# Patient Record
Sex: Female | Born: 1950
Health system: Southern US, Community
[De-identification: ages and names within clinical notes are randomized; demographics above are authoritative.]

## PROBLEM LIST (undated history)

## (undated) DIAGNOSIS — E785 Hyperlipidemia, unspecified: Secondary | ICD-10-CM

## (undated) DIAGNOSIS — T7840XA Allergy, unspecified, initial encounter: Secondary | ICD-10-CM

## (undated) DIAGNOSIS — M1990 Unspecified osteoarthritis, unspecified site: Secondary | ICD-10-CM

## (undated) DIAGNOSIS — E039 Hypothyroidism, unspecified: Secondary | ICD-10-CM

## (undated) DIAGNOSIS — K469 Unspecified abdominal hernia without obstruction or gangrene: Secondary | ICD-10-CM

## (undated) DIAGNOSIS — K5792 Diverticulitis of intestine, part unspecified, without perforation or abscess without bleeding: Secondary | ICD-10-CM

## (undated) DIAGNOSIS — R87619 Unspecified abnormal cytological findings in specimens from cervix uteri: Secondary | ICD-10-CM

## (undated) DIAGNOSIS — F419 Anxiety disorder, unspecified: Secondary | ICD-10-CM

## (undated) DIAGNOSIS — I10 Essential (primary) hypertension: Secondary | ICD-10-CM

## (undated) HISTORY — DX: Hyperlipidemia, unspecified: E78.5

## (undated) HISTORY — PX: COLPOSCOPY: SHX161

## (undated) HISTORY — DX: Unspecified osteoarthritis, unspecified site: M19.90

## (undated) HISTORY — DX: Diverticulitis of intestine, part unspecified, without perforation or abscess without bleeding: K57.92

## (undated) HISTORY — DX: Unspecified abdominal hernia without obstruction or gangrene: K46.9

## (undated) HISTORY — DX: Anxiety disorder, unspecified: F41.9

## (undated) HISTORY — DX: Hypothyroidism, unspecified: E03.9

## (undated) HISTORY — DX: Essential (primary) hypertension: I10

## (undated) HISTORY — DX: Allergy, unspecified, initial encounter: T78.40XA

## (undated) HISTORY — DX: Unspecified abnormal cytological findings in specimens from cervix uteri: R87.619

---
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
## 1991-01-14 HISTORY — PX: ABDOMINAL HYSTERECTOMY: SHX81

## 1996-01-14 DIAGNOSIS — E785 Hyperlipidemia, unspecified: Secondary | ICD-10-CM

## 1996-01-14 HISTORY — DX: Hyperlipidemia, unspecified: E78.5

## 1997-07-19 ENCOUNTER — Encounter: Admission: RE | Admit: 1997-07-19 | Discharge: 1997-10-17 | Payer: Self-pay | Admitting: Cardiology

## 1997-12-21 ENCOUNTER — Ambulatory Visit (HOSPITAL_COMMUNITY): Admission: RE | Admit: 1997-12-21 | Discharge: 1997-12-21 | Payer: Self-pay | Admitting: Gynecology

## 1997-12-21 ENCOUNTER — Encounter: Payer: Self-pay | Admitting: Gynecology

## 1999-12-27 ENCOUNTER — Other Ambulatory Visit: Admission: RE | Admit: 1999-12-27 | Discharge: 1999-12-27 | Payer: Self-pay | Admitting: Gynecology

## 2000-10-10 ENCOUNTER — Encounter: Payer: Self-pay | Admitting: Emergency Medicine

## 2000-10-10 ENCOUNTER — Emergency Department (HOSPITAL_COMMUNITY): Admission: EM | Admit: 2000-10-10 | Discharge: 2000-10-10 | Payer: Self-pay | Admitting: Emergency Medicine

## 2000-10-13 ENCOUNTER — Encounter: Payer: Self-pay | Admitting: General Surgery

## 2000-10-13 ENCOUNTER — Encounter (INDEPENDENT_AMBULATORY_CARE_PROVIDER_SITE_OTHER): Payer: Self-pay | Admitting: *Deleted

## 2000-10-13 ENCOUNTER — Ambulatory Visit (HOSPITAL_COMMUNITY): Admission: RE | Admit: 2000-10-13 | Discharge: 2000-10-14 | Payer: Self-pay | Admitting: General Surgery

## 2002-01-13 HISTORY — PX: LAPAROSCOPIC CHOLECYSTECTOMY: SUR755

## 2002-02-23 ENCOUNTER — Other Ambulatory Visit: Admission: RE | Admit: 2002-02-23 | Discharge: 2002-02-23 | Payer: Self-pay | Admitting: Gynecology

## 2003-02-23 ENCOUNTER — Encounter: Admission: RE | Admit: 2003-02-23 | Discharge: 2003-02-23 | Payer: Self-pay | Admitting: Internal Medicine

## 2003-03-17 ENCOUNTER — Other Ambulatory Visit: Admission: RE | Admit: 2003-03-17 | Discharge: 2003-03-17 | Payer: Self-pay | Admitting: Gynecology

## 2004-04-01 ENCOUNTER — Other Ambulatory Visit: Admission: RE | Admit: 2004-04-01 | Discharge: 2004-04-01 | Payer: Self-pay | Admitting: Gynecology

## 2005-04-14 ENCOUNTER — Other Ambulatory Visit: Admission: RE | Admit: 2005-04-14 | Discharge: 2005-04-14 | Payer: Self-pay | Admitting: Gynecology

## 2006-01-13 DIAGNOSIS — E039 Hypothyroidism, unspecified: Secondary | ICD-10-CM

## 2006-01-13 HISTORY — DX: Hypothyroidism, unspecified: E03.9

## 2006-05-15 ENCOUNTER — Other Ambulatory Visit: Admission: RE | Admit: 2006-05-15 | Discharge: 2006-05-15 | Payer: Self-pay | Admitting: Gynecology

## 2006-07-31 ENCOUNTER — Emergency Department (HOSPITAL_COMMUNITY): Admission: EM | Admit: 2006-07-31 | Discharge: 2006-07-31 | Payer: Self-pay | Admitting: Emergency Medicine

## 2009-05-18 ENCOUNTER — Encounter: Admission: RE | Admit: 2009-05-18 | Discharge: 2009-05-18 | Payer: Self-pay | Admitting: Internal Medicine

## 2010-04-22 ENCOUNTER — Other Ambulatory Visit: Payer: Self-pay | Admitting: Internal Medicine

## 2010-04-22 DIAGNOSIS — Z1231 Encounter for screening mammogram for malignant neoplasm of breast: Secondary | ICD-10-CM

## 2010-05-28 ENCOUNTER — Ambulatory Visit
Admission: RE | Admit: 2010-05-28 | Discharge: 2010-05-28 | Disposition: A | Discharge status: Home / Self Care | Payer: Commercial Indemnity | Source: Ambulatory Visit | Attending: Internal Medicine | Admitting: Internal Medicine

## 2010-05-28 DIAGNOSIS — Z1231 Encounter for screening mammogram for malignant neoplasm of breast: Secondary | ICD-10-CM

## 2010-05-31 NOTE — Op Note (Signed)
[place]  Patient:    Erin Day, Erin Day Visit Number: [id number] MRN: [id number]          Service Type: DSU Location: [phone] Attending Physician:  Delsa Bern Dictated by:   Lorne Skeens. Hoxworth, M.D. Proc. Date: 10/13/00 Admit Date:  10/13/2000                             Operative Report  PREOPERATIVE DIAGNOSES: 1. Cholelithiasis. 2. Cholecystitis.  POSTOPERATIVE DIAGNOSES: 1. Cholelithiasis. 2. Cholecystitis.  OPERATION:  Laparoscopic cholecystectomy with intraoperative cholangiogram.  SURGEON:  Sharlet Salina T. Hoxworth, M.D.  ASSISTANT:  Gita Kudo, M.D.  ANESTHESIA:  General.  INDICATION:  Erin Day is a 60 year old white female who approximately three to four days ago had the onset of epigastric and right upper quadrant pain which caused her to present to the emergency room.  At that time, a gallbladder ultrasound was obtained showing multiple gallstone and a normal common bile duct.  Her severe pain has resolved but she has remained sore, tired, and has somewhat upper quadrant tenderness on exam in the office yesterday.  Urgent laparoscopic cholecystectomy with intraoperative cholangiogram was then recommended and accepted.  The nature of the procedure, its indications, risks of bleeding, infection, bile leak, and bile duct injury were discussed and understood preoperatively. She is now brought to the operating room for this procedure.  DESCRIPTION OF PROCEDURE:  The patient was brought to the operating room and placed in the supine position on the operating table and general endotracheal anesthesia was induced.  She received preoperative antibiotics.  PS were in place.  The abdomen was sterilely prepped and draped.  Local anesthesia was used to infiltrate the trocar sites.  Then a 1 cm incision was made at the umbilicus and dissection carried down the midline fascia which was sharply incised for  1 cm and the peritoneum entered under direct vision.  A two mattress of 0 Vicryl, the Hasson trocar was placed, and pneumoperitoneum was established.  Under direct vision, a 10 mm trocar was placed in the subxiphoid area and two 5 mm trocars were on the right subcostal margin.  The gallbladder was visualized and was found to be acutely inflamed.  It was decompressed with needle aspirator and inflammatory omental adhesions stripped down off towards the porta hepatis.  The fundus was grasped and elevated up over the liver and the infundibulum was retracted inferolaterally.  Fibrous fatty tissue was stripped off the neck of the gallbladder toward the porta hepatis.  The distal gallbladder was thoroughly dissected.  The cystic duct was identified and the cystic duct gallbladder junction was dissected 360 degrees.  When the anatomy was clear, the cystic duct was clipped to the gallbladder junction and operative cholangiogram obtained through cystic duct.  This showed normal intrahepatic, common hepatic, and common bile ducts with free flow into the duodenum and no filling defects.  Following this, the cholangiocatheter was removed and the cystic duct was doubly clipped proximally and divided.  The cystic artery was seen and the Calot triangle coursing up on the gallbladder wall and was divided between two proximal and one distal clip.  The gallbladder was then dissected free from its bed using hook cautery, placed in the endocatch bag, and removed through the umbilicus.  The gallbladder bed was cauterized and the right upper quadrant thoroughly irrigated and suctioned.  Good hemostasis was assured.  Trocars were removed under  direct vision.  All CO2 evacuate from the peritoneal cavity.  The purse-string suture was secured to the umbilicus.  Skin incisions were closed with interrupted subcuticular 4-0 Monocryl and Steri-Strips.  Sponge, needle, and instrument counts were correct.  Dry,  sterile dressing was applied and the patient was taken to the recovery room in good condition. Dictated by:   Lorne Skeens. Hoxworth, M.D. Attending Physician:  Delsa Bern DD:  10/13/00 TD:  10/13/00 Job: [id number]

## 2011-05-06 ENCOUNTER — Other Ambulatory Visit (HOSPITAL_COMMUNITY): Payer: Self-pay | Admitting: Internal Medicine

## 2011-05-06 DIAGNOSIS — Z1231 Encounter for screening mammogram for malignant neoplasm of breast: Secondary | ICD-10-CM

## 2011-05-30 ENCOUNTER — Ambulatory Visit
Admission: RE | Admit: 2011-05-30 | Discharge: 2011-05-30 | Disposition: A | Discharge status: Home / Self Care | Payer: Commercial Indemnity | Source: Ambulatory Visit | Attending: Internal Medicine | Admitting: Internal Medicine

## 2011-05-30 DIAGNOSIS — Z1231 Encounter for screening mammogram for malignant neoplasm of breast: Secondary | ICD-10-CM

## 2012-04-16 ENCOUNTER — Encounter: Payer: Self-pay | Admitting: Internal Medicine

## 2012-04-16 ENCOUNTER — Other Ambulatory Visit: Payer: Self-pay

## 2012-04-16 DIAGNOSIS — Z1231 Encounter for screening mammogram for malignant neoplasm of breast: Secondary | ICD-10-CM

## 2012-06-04 ENCOUNTER — Encounter: Payer: Self-pay | Admitting: Internal Medicine

## 2012-06-04 ENCOUNTER — Ambulatory Visit (AMBULATORY_SURGERY_CENTER): Payer: Commercial Indemnity | Admitting: *Deleted

## 2012-06-04 VITALS — Ht 62.0 in | Wt 158.6 lb

## 2012-06-04 DIAGNOSIS — Z1211 Encounter for screening for malignant neoplasm of colon: Secondary | ICD-10-CM

## 2012-06-04 MED ORDER — MOVIPREP 100 G PO SOLR: ORAL | Status: DC

## 2012-06-11 ENCOUNTER — Ambulatory Visit
Admission: RE | Admit: 2012-06-11 | Discharge: 2012-06-11 | Disposition: A | Discharge status: Home / Self Care | Payer: Commercial Indemnity | Source: Ambulatory Visit

## 2012-06-11 DIAGNOSIS — Z1231 Encounter for screening mammogram for malignant neoplasm of breast: Secondary | ICD-10-CM

## 2012-06-13 HISTORY — PX: SKIN CANCER EXCISION: SHX779

## 2012-06-18 ENCOUNTER — Encounter: Payer: Self-pay | Admitting: Internal Medicine

## 2012-06-18 ENCOUNTER — Ambulatory Visit (AMBULATORY_SURGERY_CENTER): Payer: Commercial Indemnity | Admitting: Internal Medicine

## 2012-06-18 VITALS — BP 144/76 | HR 57 | Temp 97.8°F | Resp 27 | Ht 62.0 in | Wt 158.0 lb

## 2012-06-18 DIAGNOSIS — Z1211 Encounter for screening for malignant neoplasm of colon: Secondary | ICD-10-CM

## 2012-06-18 MED ORDER — SODIUM CHLORIDE 0.9 % IV SOLN: 500.0000 mL | INTRAVENOUS | Status: DC

## 2012-06-18 NOTE — Progress Notes (Signed)
Patient did not experience any of the following events: a burn prior to discharge; a fall within the facility; wrong site/side/patient/procedure/implant event; or a hospital transfer or hospital admission upon discharge from the facility. (G8907) Patient did not have preoperative order for IV antibiotic SSI prophylaxis. (G8918)  

## 2012-06-18 NOTE — Op Note (Signed)
[facility] [address] [place], [address]   COLONOSCOPY PROCEDURE REPORT  PATIENT: Erin, Day  MR#: [id number] BIRTHDATE: 08/19/1950 , 62  yrs. old GENDER: Female ENDOSCOPIST: Hart Carwin, MD REFERRED BY:  Creola Corn, M.D. PROCEDURE DATE:  06/18/2012 PROCEDURE:   Colonoscopy, screening ASA CLASS:   Class II INDICATIONS:Average risk patient for colon cancer and colonoscopy 2004 was normal. MEDICATIONS: MAC sedation, administered by CRNA and propofol (Diprivan) 250mg  IV  DESCRIPTION OF PROCEDURE:   After the risks and benefits and of the procedure were explained, informed consent was obtained.  A digital rectal exam revealed no abnormalities of the rectum.    The LB [id number]  endoscope was introduced through the anus and advanced to the cecum, which was identified by both the appendix and ileocecal valve .  The quality of the prep was good, using MoviPrep .  The instrument was then slowly withdrawn as the colon was fully examined.     COLON FINDINGS: There was moderate diverticulosis noted in the sigmoid colon with associated angulation, tortuosity and muscular hypertrophy.     Retroflexed views revealed no abnormalities. The scope was then withdrawn from the patient and the procedure completed.  COMPLICATIONS: There were no complications. ENDOSCOPIC IMPRESSION: There was moderate diverticulosis noted in the sigmoid colon  RECOMMENDATIONS: High fiber diet   REPEAT EXAM: In 10 year(s)  for Colonoscopy.  cc:  _______________________________ eSignedHart Carwin, MD 06/18/2012 9:02 AM     PATIENT NAME:  Erin, Day MR#: [id number]

## 2012-06-18 NOTE — Patient Instructions (Addendum)
YOU HAD AN ENDOSCOPIC PROCEDURE TODAY AT THE [facility]: Refer to the procedure report that was given to you for any specific questions about what was found during the examination.  If the procedure report does not answer your questions, please call your gastroenterologist to clarify.  If you requested that your care partner not be given the details of your procedure findings, then the procedure report has been included in a sealed envelope for you to review at your convenience later.  YOU SHOULD EXPECT: Some feelings of bloating in the abdomen. Passage of more gas than usual.  Walking can help get rid of the air that was put into your GI tract during the procedure and reduce the bloating. If you had a lower endoscopy (such as a colonoscopy or flexible sigmoidoscopy) you may notice spotting of blood in your stool or on the toilet paper. If you underwent a bowel prep for your procedure, then you may not have a normal bowel movement for a few days.  DIET: Your first meal following the procedure should be a light meal and then it is ok to progress to your normal diet.  A half-sandwich or bowl of soup is an example of a good first meal.  Heavy or fried foods are harder to digest and may make you feel nauseous or bloated.  Likewise meals heavy in dairy and vegetables can cause extra gas to form and this can also increase the bloating.  Drink plenty of fluids but you should avoid alcoholic beverages for 24 hours.  ACTIVITY: Your care partner should take you home directly after the procedure.  You should plan to take it easy, moving slowly for the rest of the day.  You can resume normal activity the day after the procedure however you should NOT DRIVE or use heavy machinery for 24 hours (because of the sedation medicines used during the test).    SYMPTOMS TO REPORT IMMEDIATELY: A gastroenterologist can be reached at any hour.  During normal business hours, 8:30 AM to 5:00 PM Monday through Friday,  call [phone].  After hours and on weekends, please call the GI answering service at [phone] who will take a message and have the physician on call contact you.   Following lower endoscopy (colonoscopy or flexible sigmoidoscopy):  Excessive amounts of blood in the stool  Significant tenderness or worsening of abdominal pains  Swelling of the abdomen that is new, acute  Fever of 100F or higher  FOLLOW UP: If any biopsies were taken you will be contacted by phone or by letter within the next 1-3 weeks.  Call your gastroenterologist if you have not heard about the biopsies in 3 weeks.  Our staff will call the home number listed on your records the next business day following your procedure to check on you and address any questions or concerns that you may have at that time regarding the information given to you following your procedure. This is a courtesy call and so if there is no answer at the home number and we have not heard from you through the emergency physician on call, we will assume that you have returned to your regular daily activities without incident.   Diverticulosis, high fiber diet, next colonoscopy 10 years-2024  SIGNATURES/CONFIDENTIALITY: You and/or your care partner have signed paperwork which will be entered into your electronic medical record.  These signatures attest to the fact that that the information above on your After Visit Summary has been reviewed and  is understood.  Full responsibility of the confidentiality of this discharge information lies with you and/or your care-partner.

## 2012-06-21 ENCOUNTER — Telehealth: Payer: Self-pay | Admitting: *Deleted

## 2012-06-21 NOTE — Telephone Encounter (Signed)
  Follow up Call-  Call back number 06/18/2012  Post procedure Call Back phone  # [phone]  Permission to leave phone message Yes     Patient questions:  Do you have a fever, pain , or abdominal swelling? no Pain Score  0 *  Have you tolerated food without any problems? yes  Have you been able to return to your normal activities? yes  Do you have any questions about your discharge instructions: Diet   no Medications  no Follow up visit  no  Do you have questions or concerns about your Care? no  Actions: * If pain score is 4 or above: No action needed, pain <4.

## 2012-06-29 ENCOUNTER — Other Ambulatory Visit: Payer: Self-pay

## 2012-07-08 ENCOUNTER — Other Ambulatory Visit: Payer: Self-pay

## 2012-08-05 ENCOUNTER — Encounter: Payer: Self-pay | Admitting: *Deleted

## 2012-08-06 ENCOUNTER — Ambulatory Visit (INDEPENDENT_AMBULATORY_CARE_PROVIDER_SITE_OTHER): Payer: Managed Care, Other (non HMO) | Admitting: Nurse Practitioner

## 2012-08-06 ENCOUNTER — Encounter: Payer: Self-pay | Admitting: Nurse Practitioner

## 2012-08-06 VITALS — BP 120/56 | HR 66 | Resp 12 | Ht 61.75 in | Wt 159.0 lb

## 2012-08-06 DIAGNOSIS — Z01419 Encounter for gynecological examination (general) (routine) without abnormal findings: Secondary | ICD-10-CM

## 2012-08-06 NOTE — Progress Notes (Signed)
62 y.o. G3P2 Divorced Caucasian Fe here for annual exam.  Off Vivelle patch for about 3 months, slowly weaned.  No vaso symptoms. Some emotional changes initially, now all better. Still using 4 " ring pessary with support. Removes pessary nightly and places again in am. Using KY jelly for lubrication.  No LMP recorded. Patient has had a hysterectomy.          Sexually active: no  The current method of family planning is status post hysterectomy.    Exercising: yes  aerobics and weight training 4x weekly Smoker:  no  Health Maintenance: Pap:   MMG:  06/2012 Colonoscopy:  06/2012 BMD:   2010 TDaP:  2013- Dr.Russo Labs: Dr. Timothy Lasso maintains all labs and urine.    reports that she quit smoking about 40 years ago. She has never used smokeless tobacco. She reports that she drinks about 0.6 ounces of alcohol per week. She reports that she does not use illicit drugs.  Past Medical History  Diagnosis Date  . Arthritis   . Allergy   . Hypertension   . Hyperlipidemia   . Hypothyroidism   . Enterocele     of vaginal vault    Past Surgical History  Procedure Laterality Date  . Tonsillectomy  1968  . Laparoscopic cholecystectomy  2004  . Abdominal hysterectomy  1993    Current Outpatient Prescriptions  Medication Sig Dispense Refill  . acetaminophen (TYLENOL) 100 MG/ML solution Take 10 mg/kg by mouth every 4 (four) hours as needed for fever.      . Biotin 2500 MCG CAPS Take by mouth daily.      . calcium-vitamin D 250-100 MG-UNIT per tablet Take 1 tablet by mouth 2 (two) times daily.      . cholecalciferol (VITAMIN D) 1000 UNITS tablet Take 1,000 Units by mouth daily.      . Fiber, Guar Gum, CHEW Chew by mouth. Takes 2 gummies daily      . fish oil-omega-3 fatty acids 1000 MG capsule Take 2 g by mouth daily.      Marland Kitchen glucosamine-chondroitin 500-400 MG tablet Take 1 tablet by mouth daily.      Marland Kitchen levothyroxine (SYNTHROID, LEVOTHROID) 88 MCG tablet Take 88 mcg by mouth daily before breakfast.       . Multiple Vitamin (MULTIVITAMIN) tablet Take 1 tablet by mouth daily.      . valsartan (DIOVAN) 160 MG tablet Take 160 mg by mouth daily.      . vitamin C (ASCORBIC ACID) 500 MG tablet Take 500 mg by mouth daily.       No current facility-administered medications for this visit.    Family History  Problem Relation Age of Onset  . Colon cancer Neg Hx   . Hypertension Father   . Breast cancer Maternal Aunt   . Heart failure Paternal Uncle   . Breast cancer Maternal Grandmother   . Diabetes Maternal Grandmother   . Multiple births Paternal Grandmother     ROS:  Pertinent items are noted in HPI.  Otherwise, a comprehensive ROS was negative.  Exam:   BP 120/56  Pulse 66  Resp 12  Ht 5' 1.75" (1.568 m)  Wt 159 lb (72.122 kg)  BMI 29.33 kg/m2 Height: 5' 1.75" (156.8 cm)  Ht Readings from Last 3 Encounters:  08/06/12 5' 1.75" (1.568 m)  06/18/12 5\' 2"  (1.575 m)  06/04/12 5\' 2"  (1.575 m)    General appearance: alert, cooperative and appears stated age Head: Normocephalic, without obvious  abnormality, atraumatic Neck: no adenopathy, supple, symmetrical, trachea midline and thyroid normal to inspection and palpation Lungs: clear to auscultation bilaterally Breasts: normal appearance, no masses or tenderness Heart: regular rate and rhythm Abdomen: soft, non-tender; no masses,  no organomegaly Extremities: extremities normal, atraumatic, no cyanosis or edema Skin: Skin color, texture, turgor normal. No rashes or lesions Lymph nodes: Cervical, supraclavicular, and axillary nodes normal. No abnormal inguinal nodes palpated Neurologic: Grossly normal   Pelvic: External genitalia:  no lesions              Urethra:  normal appearing urethra with no masses, tenderness or lesions              Bartholin's and Skene's: normal                 Vagina: normal appearing vagina with normal color and discharge, no lesions. No irritation or lesions with pessary use.              Cervix:  anteverted              Pap taken: no Bimanual Exam:  Uterus:  uterus absent              Adnexa: no mass, fullness, tenderness               Rectovaginal: Confirms               Anus:  normal sphincter tone, no lesions  A:  Well Woman with normal exam  S/P TAH secondary to fibroids - ovaries remain  History of enterocele of vaginal vault with use of 4 " ring pessary with support  P:   Pap smear as per guidelines   Mammogram due 06/2013  Continue with pessary using KY jelly return annually or prn  An After Visit Summary was printed and given to the patient.

## 2012-08-06 NOTE — Patient Instructions (Signed)

## 2012-08-10 NOTE — Progress Notes (Signed)
Encounter reviewed by Dr. Kamelia Lampkins Silva.  

## 2013-01-24 ENCOUNTER — Other Ambulatory Visit: Payer: Self-pay

## 2013-01-24 DIAGNOSIS — Z1231 Encounter for screening mammogram for malignant neoplasm of breast: Secondary | ICD-10-CM

## 2013-02-18 ENCOUNTER — Other Ambulatory Visit: Payer: Self-pay

## 2013-06-17 ENCOUNTER — Ambulatory Visit
Admission: RE | Admit: 2013-06-17 | Discharge: 2013-06-17 | Disposition: A | Discharge status: Home / Self Care | Payer: Commercial Indemnity | Source: Ambulatory Visit

## 2013-06-17 ENCOUNTER — Encounter (INDEPENDENT_AMBULATORY_CARE_PROVIDER_SITE_OTHER): Payer: Self-pay

## 2013-06-17 DIAGNOSIS — Z1231 Encounter for screening mammogram for malignant neoplasm of breast: Secondary | ICD-10-CM

## 2013-08-12 ENCOUNTER — Encounter: Payer: Self-pay | Admitting: Nurse Practitioner

## 2013-08-12 ENCOUNTER — Ambulatory Visit (INDEPENDENT_AMBULATORY_CARE_PROVIDER_SITE_OTHER): Payer: Managed Care, Other (non HMO) | Admitting: Nurse Practitioner

## 2013-08-12 VITALS — BP 122/80 | HR 52 | Ht 62.0 in | Wt 160.0 lb

## 2013-08-12 DIAGNOSIS — Z01419 Encounter for gynecological examination (general) (routine) without abnormal findings: Secondary | ICD-10-CM

## 2013-08-12 DIAGNOSIS — E2839 Other primary ovarian failure: Secondary | ICD-10-CM

## 2013-08-12 NOTE — Patient Instructions (Signed)

## 2013-08-12 NOTE — Progress Notes (Signed)
Patient ID: Erin Day, female   DOB: January 25, 1950, 63 y.o.   MRN: [id number] 63 y.o. G3P2 Divorced Caucasian Fe here for annual exam.  Still using pessary with good results.  Still takes out pessary in the evening and reinsert the next day.  Not SA.   Patient's last menstrual period was 08/14/1991.          Sexually active: no  The current method of family planning is status post hysterectomy.  Exercising: yes aerobics and weight training, yoga 4 x weekly  Smoker: no   Health Maintenance:  Pap:  hysterectomy MMG: 06/17/13, Bi-Rads 1:  Negative  Colonoscopy: 06/2012 normal, no polyp, some diverticula, repeat in 10 years. BMD: 2000 ? TDaP: 2013- Dr.Russo  Labs: Dr. Virgina Jock maintains all labs and urine.    reports that she quit smoking about 41 years ago. Her smoking use included Cigarettes. She has a .75 pack-year smoking history. She has never used smokeless tobacco. She reports that she drinks about .6 - 1.2 ounces of alcohol per week. She reports that she does not use illicit drugs.  Past Medical History  Diagnosis Date  . Arthritis   . Allergy   . Hypertension   . Hyperlipidemia 1998  . Enterocele     of vaginal vault  . Hypothyroidism 2008    Past Surgical History  Procedure Laterality Date  . Tonsillectomy  1968  . Laparoscopic cholecystectomy  2004  . Abdominal hysterectomy  1993    ovaries remain  . Skin cancer excision Left 06/2012    squamous cell left shoulder    Current Outpatient Prescriptions  Medication Sig Dispense Refill  . cholecalciferol (VITAMIN D) 1000 UNITS tablet Take 1,000 Units by mouth daily.      . Fiber, Guar Gum, CHEW Chew by mouth. Takes 2 gummies daily      . fish oil-omega-3 fatty acids 1000 MG capsule Take 2 g by mouth daily.      Marland Kitchen glucosamine-chondroitin 500-400 MG tablet Take 1 tablet by mouth daily.      Marland Kitchen levothyroxine (SYNTHROID, LEVOTHROID) 88 MCG tablet Take 88 mcg by mouth daily before breakfast.      . Multiple Vitamin  (MULTIVITAMIN) tablet Take 1 tablet by mouth daily.      . valsartan (DIOVAN) 160 MG tablet Take 160 mg by mouth daily.      . vitamin C (ASCORBIC ACID) 500 MG tablet Take 1,000 mg by mouth daily.        No current facility-administered medications for this visit.    Family History  Problem Relation Age of Onset  . Colon cancer Neg Hx   . Hypertension Father   . Dementia Father   . Breast cancer Maternal Aunt   . Heart failure Paternal Uncle   . Breast cancer Maternal Grandmother   . Diabetes Maternal Grandmother   . Multiple births Paternal Grandmother   . Heart failure Mother   . Hyperlipidemia Mother     ROS:  Pertinent items are noted in HPI.  Otherwise, a comprehensive ROS was negative.  Exam:   BP 122/80  Pulse 52  Ht 5\' 2"  (1.575 m)  Wt 160 lb (72.576 kg)  BMI 29.26 kg/m2  LMP 08/14/1991 Height: 5\' 2"  (157.5 cm)  Ht Readings from Last 3 Encounters:  08/12/13 5\' 2"  (1.575 m)  08/06/12 5' 1.75" (1.568 m)  06/18/12 5\' 2"  (1.575 m)    General appearance: alert, cooperative and appears stated age Head: Normocephalic, without obvious abnormality,  atraumatic Neck: no adenopathy, supple, symmetrical, trachea midline and thyroid normal to inspection and palpation Lungs: clear to auscultation bilaterally Breasts: normal appearance, no masses or tenderness Heart: regular rate and rhythm Abdomen: soft, non-tender; no masses,  no organomegaly Extremities: extremities normal, atraumatic, no cyanosis or edema Skin: Skin color, texture, turgor normal. No rashes or lesions Lymph nodes: Cervical, supraclavicular, and axillary nodes normal. No abnormal inguinal nodes palpated Neurologic: Grossly normal   Pelvic: External genitalia:  no lesions              Urethra:  normal appearing urethra with no masses, tenderness or lesions              Bartholin's and Skene's: normal                 Vagina: normal appearing vagina with normal color and discharge, no lesions, no  excoriation, pessary was removed by patient prior to exam              Cervix: absent              Pap taken: No. Bimanual Exam:  Uterus:  uterus absent              Adnexa: no mass, fullness, tenderness               Rectovaginal: Confirms               Anus:  normal sphincter tone, no lesions  A:  Well Woman with normal exam  S/P TAH 1993 secondary to fibroids  Postmenopausal off ERT 04/2012  Atrophic vaginitis  Enterocele of vaginal vault with use of 4" ring pessary with support  P:   Reviewed health and wellness pertinent to exam  Pap smear not taken today  Mammogram is due 6/16  Continue with usual maintenance of pessary - see no need to add estrogen cream at this time since she is doing well.  Counseled on breast self exam, mammography screening, adequate intake of calcium and vitamin D, diet and exercise, Kegel's exercises return annually or prn  An After Visit Summary was printed and given to the patient.

## 2013-08-21 NOTE — Progress Notes (Signed)
Encounter reviewed by Dr. Leydy Worthey Silva.  

## 2013-10-28 ENCOUNTER — Encounter: Payer: Self-pay | Admitting: Internal Medicine

## 2013-11-14 ENCOUNTER — Encounter: Payer: Self-pay | Admitting: Nurse Practitioner

## 2014-05-26 ENCOUNTER — Other Ambulatory Visit: Payer: Self-pay

## 2014-05-26 DIAGNOSIS — Z1231 Encounter for screening mammogram for malignant neoplasm of breast: Secondary | ICD-10-CM

## 2014-06-23 ENCOUNTER — Ambulatory Visit
Admission: RE | Admit: 2014-06-23 | Discharge: 2014-06-23 | Disposition: A | Discharge status: Home / Self Care | Payer: Commercial Indemnity | Source: Ambulatory Visit

## 2014-06-23 DIAGNOSIS — Z1231 Encounter for screening mammogram for malignant neoplasm of breast: Secondary | ICD-10-CM

## 2014-09-01 ENCOUNTER — Ambulatory Visit (INDEPENDENT_AMBULATORY_CARE_PROVIDER_SITE_OTHER): Payer: Managed Care, Other (non HMO) | Admitting: Nurse Practitioner

## 2014-09-01 ENCOUNTER — Encounter: Payer: Self-pay | Admitting: Nurse Practitioner

## 2014-09-01 VITALS — BP 142/88 | HR 60 | Ht 62.0 in | Wt 163.0 lb

## 2014-09-01 DIAGNOSIS — Z01419 Encounter for gynecological examination (general) (routine) without abnormal findings: Secondary | ICD-10-CM | POA: Diagnosis not present

## 2014-09-01 DIAGNOSIS — N993 Prolapse of vaginal vault after hysterectomy: Secondary | ICD-10-CM

## 2014-09-01 NOTE — Patient Instructions (Signed)

## 2014-09-01 NOTE — Progress Notes (Signed)
Patient ID: Erin Day, female   DOB: 07-07-1950, 64 y.o.   MRN: [id number] 64 y.o. G1P0002 Divorced  Caucasian Fe here for annual exam.  Using size #4 pessary which is 2 3/4" during the day ans sometimes leaves it in at night.  She does take the pessary out and cleans it every 1-2 days.  Still does not use vaginal estrogen cream and for the most part doing OK with using only KY jelly.   Not SA.  Patient's last menstrual period was 08/14/1991 (approximate).          Sexually active: No.  The current method of family planning is none.    Exercising: Yes.    cardio and strength training 3-4 times per week Smoker:  no  Health Maintenance: Pap: hysterectomy MMG: 06/23/14, Bi-Rads 1: Negative  Colonoscopy: 06/2012 normal, no polyp, some diverticula, repeat in 10 years. BMD: 2000?  Pt is trying to schedule, but needs on a Friday and is having difficulty with this TDaP: 2013- Dr.Russo  Labs: Dr. Virgina Jock maintains all labs and urine.   reports that she quit smoking about 42 years ago. Her smoking use included Cigarettes. She has a .75 pack-year smoking history. She has never used smokeless tobacco. She reports that she drinks about 0.6 - 1.2 oz of alcohol per week. She reports that she does not use illicit drugs.  Past Medical History  Diagnosis Date  . Arthritis   . Allergy   . Hypertension   . Hyperlipidemia 1998  . Enterocele     of vaginal vault  . Hypothyroidism 2008    Past Surgical History  Procedure Laterality Date  . Tonsillectomy  1968  . Laparoscopic cholecystectomy  2004  . Abdominal hysterectomy  1993    ovaries remain, due to fibroids  . Skin cancer excision Left 06/2012    squamous cell left shoulder    Current Outpatient Prescriptions  Medication Sig Dispense Refill  . cholecalciferol (VITAMIN D) 1000 UNITS tablet Take 1,000 Units by mouth daily.    . Fiber, Guar Gum, CHEW Chew by mouth. Takes 2 gummies daily    . fish oil-omega-3 fatty acids 1000 MG capsule  Take 2 g by mouth daily.    Marland Kitchen levothyroxine (SYNTHROID, LEVOTHROID) 88 MCG tablet Take 88 mcg by mouth daily before breakfast.    . Multiple Vitamin (MULTIVITAMIN) tablet Take 1 tablet by mouth daily.    . valsartan (DIOVAN) 160 MG tablet Take 160 mg by mouth daily.    . vitamin C (ASCORBIC ACID) 500 MG tablet Take 1,000 mg by mouth daily.      No current facility-administered medications for this visit.    Family History  Problem Relation Age of Onset  . Colon cancer Neg Hx   . Hypertension Father   . Dementia Father   . Breast cancer Maternal Aunt   . Heart failure Paternal Uncle   . Breast cancer Maternal Grandmother   . Diabetes Maternal Grandmother   . Multiple births Paternal Grandmother   . Heart failure Mother   . Hyperlipidemia Mother     ROS:  Pertinent items are noted in HPI.  Otherwise, a comprehensive ROS was negative.  Exam:   BP 142/88 mmHg  Pulse 60  Ht 5\' 2"  (1.575 m)  Wt 163 lb (73.936 kg)  BMI 29.81 kg/m2  LMP 08/14/1991 (Approximate) Height: 5\' 2"  (157.5 cm) Ht Readings from Last 3 Encounters:  09/01/14 5\' 2"  (1.575 m)  08/12/13 5\' 2"  (1.575 m)  08/06/12 5' 1.75" (1.568 m)    General appearance: alert, cooperative and appears stated age Head: Normocephalic, without obvious abnormality, atraumatic Neck: no adenopathy, supple, symmetrical, trachea midline and thyroid normal to inspection and palpation Lungs: clear to auscultation bilaterally Breasts: normal appearance, no masses or tenderness Heart: regular rate and rhythm Abdomen: soft, non-tender; no masses,  no organomegaly Extremities: extremities normal, atraumatic, no cyanosis or edema Skin: Skin color, texture, turgor normal. No rashes or lesions Lymph nodes: Cervical, supraclavicular, and axillary nodes normal. No abnormal inguinal nodes palpated Neurologic: Grossly normal   Pelvic: External genitalia:  no lesions              Urethra:  normal appearing urethra with no masses, tenderness  or lesions              Bartholin's and Skene's: normal                 Vagina: normal appearing vagina with normal color and discharge, no lesions              Cervix: absent              Pap taken: No. Bimanual Exam:  Uterus:  uterus absent              Adnexa: no mass, fullness, tenderness               Rectovaginal: Confirms               Anus:  normal sphincter tone, no lesions  Chaperone present:  yes  A:  Well Woman with normal exam  S/P TAH 1993 secondary to fibroids Postmenopausal off ERT 04/2012 Atrophic vaginitis Enterocele of vaginal vault with use of # 4 ring pessary with support   P:   Reviewed health and wellness pertinent to exam  Pap smear as above  Mammogram is due 06/2015  Will follow if vaginal estrogen is needed to call.  New pessary is given today. # 4 which is 2 3/4"  Counseled on breast self exam, mammography screening, adequate intake of calcium and vitamin D, diet and exercise, Kegel's exercises return annually or prn  An After Visit Summary was printed and given to the patient.

## 2014-09-03 NOTE — Progress Notes (Signed)
Encounter reviewed by Dr. Brook Amundson C. Silva.  

## 2015-03-07 DIAGNOSIS — E039 Hypothyroidism, unspecified: Secondary | ICD-10-CM | POA: Diagnosis not present

## 2015-03-07 DIAGNOSIS — F419 Anxiety disorder, unspecified: Secondary | ICD-10-CM | POA: Diagnosis not present

## 2015-03-07 DIAGNOSIS — I10 Essential (primary) hypertension: Secondary | ICD-10-CM | POA: Diagnosis not present

## 2015-03-07 DIAGNOSIS — R002 Palpitations: Secondary | ICD-10-CM | POA: Diagnosis not present

## 2015-03-07 DIAGNOSIS — D509 Iron deficiency anemia, unspecified: Secondary | ICD-10-CM | POA: Diagnosis not present

## 2015-03-07 DIAGNOSIS — Z6829 Body mass index (BMI) 29.0-29.9, adult: Secondary | ICD-10-CM | POA: Diagnosis not present

## 2015-03-07 DIAGNOSIS — K219 Gastro-esophageal reflux disease without esophagitis: Secondary | ICD-10-CM | POA: Diagnosis not present

## 2015-05-18 DIAGNOSIS — E784 Other hyperlipidemia: Secondary | ICD-10-CM | POA: Diagnosis not present

## 2015-05-18 DIAGNOSIS — E038 Other specified hypothyroidism: Secondary | ICD-10-CM | POA: Diagnosis not present

## 2015-05-18 DIAGNOSIS — R8290 Unspecified abnormal findings in urine: Secondary | ICD-10-CM | POA: Diagnosis not present

## 2015-05-18 DIAGNOSIS — I10 Essential (primary) hypertension: Secondary | ICD-10-CM | POA: Diagnosis not present

## 2015-05-18 DIAGNOSIS — N390 Urinary tract infection, site not specified: Secondary | ICD-10-CM | POA: Diagnosis not present

## 2015-05-25 DIAGNOSIS — E784 Other hyperlipidemia: Secondary | ICD-10-CM | POA: Diagnosis not present

## 2015-05-25 DIAGNOSIS — M859 Disorder of bone density and structure, unspecified: Secondary | ICD-10-CM | POA: Diagnosis not present

## 2015-05-25 DIAGNOSIS — Z6828 Body mass index (BMI) 28.0-28.9, adult: Secondary | ICD-10-CM | POA: Diagnosis not present

## 2015-05-25 DIAGNOSIS — N3941 Urge incontinence: Secondary | ICD-10-CM | POA: Diagnosis not present

## 2015-05-25 DIAGNOSIS — F418 Other specified anxiety disorders: Secondary | ICD-10-CM | POA: Diagnosis not present

## 2015-05-25 DIAGNOSIS — R808 Other proteinuria: Secondary | ICD-10-CM | POA: Diagnosis not present

## 2015-05-25 DIAGNOSIS — H53419 Scotoma involving central area, unspecified eye: Secondary | ICD-10-CM | POA: Diagnosis not present

## 2015-05-25 DIAGNOSIS — Z0000 Encounter for general adult medical examination without abnormal findings: Secondary | ICD-10-CM | POA: Diagnosis not present

## 2015-05-25 DIAGNOSIS — R002 Palpitations: Secondary | ICD-10-CM | POA: Diagnosis not present

## 2015-05-25 DIAGNOSIS — I7300 Raynaud's syndrome without gangrene: Secondary | ICD-10-CM | POA: Diagnosis not present

## 2015-05-25 DIAGNOSIS — E038 Other specified hypothyroidism: Secondary | ICD-10-CM | POA: Diagnosis not present

## 2015-05-25 DIAGNOSIS — Z1389 Encounter for screening for other disorder: Secondary | ICD-10-CM | POA: Diagnosis not present

## 2015-05-25 DIAGNOSIS — I10 Essential (primary) hypertension: Secondary | ICD-10-CM | POA: Diagnosis not present

## 2015-05-31 DIAGNOSIS — Z23 Encounter for immunization: Secondary | ICD-10-CM | POA: Diagnosis not present

## 2015-06-04 ENCOUNTER — Other Ambulatory Visit: Payer: Self-pay

## 2015-06-04 DIAGNOSIS — Z1231 Encounter for screening mammogram for malignant neoplasm of breast: Secondary | ICD-10-CM

## 2015-07-06 ENCOUNTER — Ambulatory Visit
Admission: RE | Admit: 2015-07-06 | Discharge: 2015-07-06 | Disposition: A | Discharge status: Home / Self Care | Payer: Medicare Other | Source: Ambulatory Visit

## 2015-07-06 DIAGNOSIS — Z1231 Encounter for screening mammogram for malignant neoplasm of breast: Secondary | ICD-10-CM

## 2015-07-20 DIAGNOSIS — C44619 Basal cell carcinoma of skin of left upper limb, including shoulder: Secondary | ICD-10-CM | POA: Diagnosis not present

## 2015-07-20 DIAGNOSIS — L820 Inflamed seborrheic keratosis: Secondary | ICD-10-CM | POA: Diagnosis not present

## 2015-07-20 DIAGNOSIS — D2272 Melanocytic nevi of left lower limb, including hip: Secondary | ICD-10-CM | POA: Diagnosis not present

## 2015-07-20 DIAGNOSIS — D485 Neoplasm of uncertain behavior of skin: Secondary | ICD-10-CM | POA: Diagnosis not present

## 2015-07-20 DIAGNOSIS — L570 Actinic keratosis: Secondary | ICD-10-CM | POA: Diagnosis not present

## 2015-07-20 DIAGNOSIS — L821 Other seborrheic keratosis: Secondary | ICD-10-CM | POA: Diagnosis not present

## 2015-07-20 DIAGNOSIS — Z85828 Personal history of other malignant neoplasm of skin: Secondary | ICD-10-CM | POA: Diagnosis not present

## 2015-07-20 DIAGNOSIS — L814 Other melanin hyperpigmentation: Secondary | ICD-10-CM | POA: Diagnosis not present

## 2015-07-20 DIAGNOSIS — D225 Melanocytic nevi of trunk: Secondary | ICD-10-CM | POA: Diagnosis not present

## 2015-08-10 DIAGNOSIS — C44619 Basal cell carcinoma of skin of left upper limb, including shoulder: Secondary | ICD-10-CM | POA: Diagnosis not present

## 2015-08-10 DIAGNOSIS — Z85828 Personal history of other malignant neoplasm of skin: Secondary | ICD-10-CM | POA: Diagnosis not present

## 2015-09-07 ENCOUNTER — Encounter: Payer: Self-pay | Admitting: Nurse Practitioner

## 2015-09-07 ENCOUNTER — Ambulatory Visit (INDEPENDENT_AMBULATORY_CARE_PROVIDER_SITE_OTHER): Payer: Medicare Other | Admitting: Nurse Practitioner

## 2015-09-07 VITALS — BP 136/92 | HR 64 | Ht 61.75 in | Wt 160.0 lb

## 2015-09-07 DIAGNOSIS — Z124 Encounter for screening for malignant neoplasm of cervix: Secondary | ICD-10-CM

## 2015-09-07 DIAGNOSIS — N993 Prolapse of vaginal vault after hysterectomy: Secondary | ICD-10-CM

## 2015-09-07 DIAGNOSIS — Z01419 Encounter for gynecological examination (general) (routine) without abnormal findings: Secondary | ICD-10-CM

## 2015-09-07 DIAGNOSIS — M8580 Other specified disorders of bone density and structure, unspecified site: Secondary | ICD-10-CM

## 2015-09-07 DIAGNOSIS — I10 Essential (primary) hypertension: Secondary | ICD-10-CM

## 2015-09-07 NOTE — Patient Instructions (Signed)

## 2015-09-07 NOTE — Progress Notes (Signed)
Reviewed personally.  M. Suzanne Alondra Vandeven, MD.  

## 2015-09-07 NOTE — Progress Notes (Signed)
Patient ID: Erin Day, female   DOB: 17-Dec-1950, 65 y.o.   MRN: [id number]  65 y.o. [id number] Divorced  Caucasian Fe here for annual exam.  No new problems.  Really likes pessary and takes it in and out every few days.  Still uses KY jelly only.  She has seen no vaginal bleeding.  Works part time at 3 days a week.  Her son who lived in [place] now lives here.  They have a new baby girl and pt is loving being able to care for her.  Daughter lives locally as well.  Her mother now age 11 is doing quite well.     Patient's last menstrual period was 08/14/1991 (approximate).          Sexually active: No.  The current method of family planning is abstinence.    Exercising: Yes.    Gym/ health club routine includes aerobics and muscle training 4 days per week. Smoker:  Former smoker  Health Maintenance: Pap: hysterectomy MMG: 07/06/15, 3D, Bi-Rads 1: Negative  Colonoscopy: 06/2012 normal, no polyp, some diverticula, repeat in 10 years. BMD: 2017, Dr. Virgina Jock - she thought it showed Osteopenia TDaP: 2013 Shingles: 2014 Pneumonia: 2017 Prevnar 13 #1 Hep C and HIV: Declined, discussed with Dr. Virgina Jock Labs: Dr. Virgina Jock takes care of all labs and urine   reports that she quit smoking about 43 years ago. Her smoking use included Cigarettes. She has a 0.75 pack-year smoking history. She has never used smokeless tobacco. She reports that she drinks about 0.6 - 1.2 oz of alcohol per week . She reports that she does not use drugs.  Past Medical History:  Diagnosis Date  . Allergy   . Arthritis   . Enterocele    of vaginal vault  . Hyperlipidemia 1998  . Hypertension   . Hypothyroidism 2008    Past Surgical History:  Procedure Laterality Date  . ABDOMINAL HYSTERECTOMY  1993   ovaries remain, due to fibroids  . LAPAROSCOPIC CHOLECYSTECTOMY  2004  . SKIN CANCER EXCISION Left 06/2012   squamous cell left shoulder  . TONSILLECTOMY  1968    Current Outpatient Prescriptions  Medication Sig  Dispense Refill  . cholecalciferol (VITAMIN D) 1000 UNITS tablet Take 1,000 Units by mouth daily.    . Fiber, Guar Gum, CHEW Chew by mouth. Takes 2 gummies daily    . fish oil-omega-3 fatty acids 1000 MG capsule Take 2 g by mouth daily.    Marland Kitchen levothyroxine (SYNTHROID, LEVOTHROID) 88 MCG tablet Take 88 mcg by mouth daily before breakfast.    . Multiple Vitamin (MULTIVITAMIN) tablet Take 1 tablet by mouth daily.    . valsartan (DIOVAN) 160 MG tablet Take 160 mg by mouth daily.    . vitamin C (ASCORBIC ACID) 500 MG tablet Take 1,000 mg by mouth daily.      No current facility-administered medications for this visit.     Family History  Problem Relation Age of Onset  . Colon cancer Neg Hx   . Hypertension Father   . Dementia Father   . Breast cancer Maternal Aunt   . Heart failure Paternal Uncle   . Breast cancer Maternal Grandmother   . Diabetes Maternal Grandmother   . Multiple births Paternal Grandmother   . Heart failure Mother   . Hyperlipidemia Mother     ROS:  Pertinent items are noted in HPI.  Otherwise, a comprehensive ROS was negative.  Exam:   LMP 08/14/1991 (Approximate)    Ht  Readings from Last 3 Encounters:  09/01/14 5\' 2"  (1.575 m)  08/12/13 5\' 2"  (1.575 m)  08/06/12 5' 1.75" (1.568 m)    General appearance: alert, cooperative and appears stated age Head: Normocephalic, without obvious abnormality, atraumatic Neck: no adenopathy, supple, symmetrical, trachea midline and thyroid normal to inspection and palpation Lungs: clear to auscultation bilaterally Breasts: normal appearance, no masses or tenderness Heart: regular rate and rhythm Abdomen: soft, non-tender; no masses,  no organomegaly Extremities: extremities normal, atraumatic, no cyanosis or edema Skin: Skin color, texture, turgor normal. No rashes or lesions Lymph nodes: Cervical, supraclavicular, and axillary nodes normal. No abnormal inguinal nodes palpated Neurologic: Grossly normal   Pelvic:  External genitalia:  no lesions              Urethra:  normal appearing urethra with no masses, tenderness or lesions              Bartholin's and Skene's: normal                 Vagina: normal appearing vagina with normal color and discharge, no lesions, no erosions or redness.  No sign of infection. Pessary is out during her visit and she reinserted after visit.  Vaginal prolapse.              Cervix: absent              Pap taken: No. Bimanual Exam:  Uterus:  uterus absent              Adnexa: no mass, fullness, tenderness               Rectovaginal: Confirms               Anus:  normal sphincter tone, no lesions  Chaperone present: no  A:  Well Woman with normal exam             S/P TAH 1993 secondary to fibroids Postmenopausal off ERT 04/2012 Atrophic vaginitis - no vaginal estrogen Enterocele of vaginal vault with use of # 4 ring pessary with support  History of HTN, Osteopenia   P:   Reviewed health and wellness pertinent to exam  Pap smear as above  Mammogram is due 06/2016  Continue with pessary use - new pessary 2016  Counseled on breast self exam, mammography screening, adequate intake of calcium and vitamin D, diet and exercise, Kegel's exercises return annually or prn  Will get ROI for BMD and immunization record  An After Visit Summary was printed and given to the patient.

## 2015-10-13 DIAGNOSIS — Z23 Encounter for immunization: Secondary | ICD-10-CM | POA: Diagnosis not present

## 2015-11-26 DIAGNOSIS — I10 Essential (primary) hypertension: Secondary | ICD-10-CM | POA: Diagnosis not present

## 2015-11-26 DIAGNOSIS — Z6829 Body mass index (BMI) 29.0-29.9, adult: Secondary | ICD-10-CM | POA: Diagnosis not present

## 2015-11-26 DIAGNOSIS — M859 Disorder of bone density and structure, unspecified: Secondary | ICD-10-CM | POA: Diagnosis not present

## 2015-11-26 DIAGNOSIS — F418 Other specified anxiety disorders: Secondary | ICD-10-CM | POA: Diagnosis not present

## 2015-11-26 DIAGNOSIS — E038 Other specified hypothyroidism: Secondary | ICD-10-CM | POA: Diagnosis not present

## 2016-05-23 ENCOUNTER — Other Ambulatory Visit: Payer: Self-pay | Admitting: Internal Medicine

## 2016-05-23 DIAGNOSIS — M859 Disorder of bone density and structure, unspecified: Secondary | ICD-10-CM | POA: Diagnosis not present

## 2016-05-23 DIAGNOSIS — E038 Other specified hypothyroidism: Secondary | ICD-10-CM | POA: Diagnosis not present

## 2016-05-23 DIAGNOSIS — E784 Other hyperlipidemia: Secondary | ICD-10-CM | POA: Diagnosis not present

## 2016-05-23 DIAGNOSIS — I10 Essential (primary) hypertension: Secondary | ICD-10-CM | POA: Diagnosis not present

## 2016-05-23 DIAGNOSIS — Z1231 Encounter for screening mammogram for malignant neoplasm of breast: Secondary | ICD-10-CM

## 2016-05-30 DIAGNOSIS — I10 Essential (primary) hypertension: Secondary | ICD-10-CM | POA: Diagnosis not present

## 2016-05-30 DIAGNOSIS — Z1389 Encounter for screening for other disorder: Secondary | ICD-10-CM | POA: Diagnosis not present

## 2016-05-30 DIAGNOSIS — K589 Irritable bowel syndrome without diarrhea: Secondary | ICD-10-CM | POA: Diagnosis not present

## 2016-05-30 DIAGNOSIS — Z0000 Encounter for general adult medical examination without abnormal findings: Secondary | ICD-10-CM | POA: Diagnosis not present

## 2016-05-30 DIAGNOSIS — E038 Other specified hypothyroidism: Secondary | ICD-10-CM | POA: Diagnosis not present

## 2016-05-30 DIAGNOSIS — E784 Other hyperlipidemia: Secondary | ICD-10-CM | POA: Diagnosis not present

## 2016-05-30 DIAGNOSIS — K5790 Diverticulosis of intestine, part unspecified, without perforation or abscess without bleeding: Secondary | ICD-10-CM | POA: Diagnosis not present

## 2016-05-30 DIAGNOSIS — M859 Disorder of bone density and structure, unspecified: Secondary | ICD-10-CM | POA: Diagnosis not present

## 2016-05-30 DIAGNOSIS — Z6829 Body mass index (BMI) 29.0-29.9, adult: Secondary | ICD-10-CM | POA: Diagnosis not present

## 2016-05-30 DIAGNOSIS — R808 Other proteinuria: Secondary | ICD-10-CM | POA: Diagnosis not present

## 2016-05-30 DIAGNOSIS — I7300 Raynaud's syndrome without gangrene: Secondary | ICD-10-CM | POA: Diagnosis not present

## 2016-05-30 DIAGNOSIS — F418 Other specified anxiety disorders: Secondary | ICD-10-CM | POA: Diagnosis not present

## 2016-06-18 DIAGNOSIS — I10 Essential (primary) hypertension: Secondary | ICD-10-CM | POA: Diagnosis not present

## 2016-06-20 DIAGNOSIS — I10 Essential (primary) hypertension: Secondary | ICD-10-CM | POA: Diagnosis not present

## 2016-07-11 ENCOUNTER — Ambulatory Visit
Admission: RE | Admit: 2016-07-11 | Discharge: 2016-07-11 | Disposition: A | Discharge status: Home / Self Care | Payer: Medicare Other | Source: Ambulatory Visit | Attending: Internal Medicine | Admitting: Internal Medicine

## 2016-07-11 DIAGNOSIS — Z1231 Encounter for screening mammogram for malignant neoplasm of breast: Secondary | ICD-10-CM | POA: Diagnosis not present

## 2016-07-28 ENCOUNTER — Telehealth: Payer: Self-pay | Admitting: Obstetrics and Gynecology

## 2016-07-28 NOTE — Telephone Encounter (Signed)
Left message on voicemail to call and reschedule cancelled appointment. °

## 2016-08-01 DIAGNOSIS — L570 Actinic keratosis: Secondary | ICD-10-CM | POA: Diagnosis not present

## 2016-08-01 DIAGNOSIS — L814 Other melanin hyperpigmentation: Secondary | ICD-10-CM | POA: Diagnosis not present

## 2016-08-01 DIAGNOSIS — L821 Other seborrheic keratosis: Secondary | ICD-10-CM | POA: Diagnosis not present

## 2016-08-01 DIAGNOSIS — D225 Melanocytic nevi of trunk: Secondary | ICD-10-CM | POA: Diagnosis not present

## 2016-08-01 DIAGNOSIS — Z85828 Personal history of other malignant neoplasm of skin: Secondary | ICD-10-CM | POA: Diagnosis not present

## 2016-08-01 DIAGNOSIS — D1801 Hemangioma of skin and subcutaneous tissue: Secondary | ICD-10-CM | POA: Diagnosis not present

## 2016-09-12 ENCOUNTER — Ambulatory Visit: Payer: Medicare Other | Admitting: Nurse Practitioner

## 2016-09-24 DIAGNOSIS — Z960 Presence of urogenital implants: Secondary | ICD-10-CM | POA: Diagnosis not present

## 2016-09-24 DIAGNOSIS — N8110 Cystocele, unspecified: Secondary | ICD-10-CM | POA: Diagnosis not present

## 2016-11-08 DIAGNOSIS — Z23 Encounter for immunization: Secondary | ICD-10-CM | POA: Diagnosis not present

## 2016-11-10 DIAGNOSIS — E038 Other specified hypothyroidism: Secondary | ICD-10-CM | POA: Diagnosis not present

## 2016-11-10 DIAGNOSIS — Z6829 Body mass index (BMI) 29.0-29.9, adult: Secondary | ICD-10-CM | POA: Diagnosis not present

## 2016-11-10 DIAGNOSIS — F418 Other specified anxiety disorders: Secondary | ICD-10-CM | POA: Diagnosis not present

## 2016-11-10 DIAGNOSIS — I10 Essential (primary) hypertension: Secondary | ICD-10-CM | POA: Diagnosis not present

## 2017-03-10 DIAGNOSIS — H2513 Age-related nuclear cataract, bilateral: Secondary | ICD-10-CM | POA: Diagnosis not present

## 2017-05-26 ENCOUNTER — Other Ambulatory Visit: Payer: Self-pay | Admitting: Internal Medicine

## 2017-05-26 DIAGNOSIS — Z1231 Encounter for screening mammogram for malignant neoplasm of breast: Secondary | ICD-10-CM

## 2017-05-29 DIAGNOSIS — E038 Other specified hypothyroidism: Secondary | ICD-10-CM | POA: Diagnosis not present

## 2017-05-29 DIAGNOSIS — E7849 Other hyperlipidemia: Secondary | ICD-10-CM | POA: Diagnosis not present

## 2017-05-29 DIAGNOSIS — M859 Disorder of bone density and structure, unspecified: Secondary | ICD-10-CM | POA: Diagnosis not present

## 2017-05-29 DIAGNOSIS — R82998 Other abnormal findings in urine: Secondary | ICD-10-CM | POA: Diagnosis not present

## 2017-06-05 DIAGNOSIS — E038 Other specified hypothyroidism: Secondary | ICD-10-CM | POA: Diagnosis not present

## 2017-06-05 DIAGNOSIS — Z0000 Encounter for general adult medical examination without abnormal findings: Secondary | ICD-10-CM | POA: Diagnosis not present

## 2017-06-05 DIAGNOSIS — M859 Disorder of bone density and structure, unspecified: Secondary | ICD-10-CM | POA: Diagnosis not present

## 2017-06-05 DIAGNOSIS — I10 Essential (primary) hypertension: Secondary | ICD-10-CM | POA: Diagnosis not present

## 2017-06-05 DIAGNOSIS — E7849 Other hyperlipidemia: Secondary | ICD-10-CM | POA: Diagnosis not present

## 2017-06-05 DIAGNOSIS — Z1389 Encounter for screening for other disorder: Secondary | ICD-10-CM | POA: Diagnosis not present

## 2017-06-05 DIAGNOSIS — Z6831 Body mass index (BMI) 31.0-31.9, adult: Secondary | ICD-10-CM | POA: Diagnosis not present

## 2017-06-05 DIAGNOSIS — K589 Irritable bowel syndrome without diarrhea: Secondary | ICD-10-CM | POA: Diagnosis not present

## 2017-06-05 DIAGNOSIS — H9319 Tinnitus, unspecified ear: Secondary | ICD-10-CM | POA: Diagnosis not present

## 2017-06-05 DIAGNOSIS — F418 Other specified anxiety disorders: Secondary | ICD-10-CM | POA: Diagnosis not present

## 2017-06-05 DIAGNOSIS — M1990 Unspecified osteoarthritis, unspecified site: Secondary | ICD-10-CM | POA: Diagnosis not present

## 2017-06-05 DIAGNOSIS — R809 Proteinuria, unspecified: Secondary | ICD-10-CM | POA: Diagnosis not present

## 2017-06-25 DIAGNOSIS — H903 Sensorineural hearing loss, bilateral: Secondary | ICD-10-CM | POA: Insufficient documentation

## 2017-06-25 DIAGNOSIS — H9313 Tinnitus, bilateral: Secondary | ICD-10-CM | POA: Insufficient documentation

## 2017-07-13 ENCOUNTER — Ambulatory Visit
Admission: RE | Admit: 2017-07-13 | Discharge: 2017-07-13 | Disposition: A | Discharge status: Home / Self Care | Payer: Medicare Other | Source: Ambulatory Visit | Attending: Internal Medicine | Admitting: Internal Medicine

## 2017-07-13 ENCOUNTER — Ambulatory Visit: Payer: Medicare Other

## 2017-07-13 DIAGNOSIS — Z1231 Encounter for screening mammogram for malignant neoplasm of breast: Secondary | ICD-10-CM

## 2017-09-29 ENCOUNTER — Ambulatory Visit: Payer: Medicare Other | Admitting: Certified Nurse Midwife

## 2017-10-06 ENCOUNTER — Ambulatory Visit: Payer: Medicare Other | Admitting: Certified Nurse Midwife

## 2017-10-07 ENCOUNTER — Other Ambulatory Visit (HOSPITAL_COMMUNITY)
Admission: RE | Admit: 2017-10-07 | Discharge: 2017-10-07 | Disposition: A | Discharge status: Home / Self Care | Payer: Medicare Other | Source: Ambulatory Visit | Attending: Certified Nurse Midwife | Admitting: Certified Nurse Midwife

## 2017-10-07 ENCOUNTER — Other Ambulatory Visit: Payer: Self-pay

## 2017-10-07 ENCOUNTER — Encounter: Payer: Self-pay | Admitting: Certified Nurse Midwife

## 2017-10-07 ENCOUNTER — Ambulatory Visit (INDEPENDENT_AMBULATORY_CARE_PROVIDER_SITE_OTHER): Payer: Medicare Other | Admitting: Certified Nurse Midwife

## 2017-10-07 VITALS — BP 122/80 | HR 70 | Resp 16 | Ht 61.75 in | Wt 159.0 lb

## 2017-10-07 DIAGNOSIS — Z124 Encounter for screening for malignant neoplasm of cervix: Secondary | ICD-10-CM | POA: Insufficient documentation

## 2017-10-07 DIAGNOSIS — N898 Other specified noninflammatory disorders of vagina: Secondary | ICD-10-CM

## 2017-10-07 DIAGNOSIS — N951 Menopausal and female climacteric states: Secondary | ICD-10-CM | POA: Diagnosis not present

## 2017-10-07 DIAGNOSIS — T8389XA Other specified complication of genitourinary prosthetic devices, implants and grafts, initial encounter: Principal | ICD-10-CM

## 2017-10-07 NOTE — Progress Notes (Addendum)
Subjective:   67 y.o. Divorced White female [id number] here for pessary check.  Patient has been using following pessary style and size:  #4 ring pessary with support..  She describes the following issues with the pessary:  Bleeding at different intervals. Last bleeding episode was 7 days ago. Patient removes pessary nightly and uses KY jelly as lubricant for insertion. Patient is using pessary for bladder support. Denies pain with bleeding, but concerned continues to happen.  She was last seen here 09/07/15. History of TAH with ovaries retained.   Use of protective clothing such as Depends or pads No..  Constipation issues with use of pessary No..  She is not sexually active.     ROS:   no breast pain or new or enlarging lumps on self exam, no discharge or pelvic pain,   no dysuria, trouble voiding or hematuria. Compliant to use of vaginal cream , patient has never used cream, just KY jelly. Complaining of vaginal bleeding. Denies fever or chills.  General Exam:    BP 122/80   Pulse 70   Resp 16   Ht 5' 1.75" (1.568 m)   Wt 159 lb (72.1 kg)   LMP 08/14/1991 (Approximate)   BMI 29.32 kg/m   General appearance: alert, cooperative, appears stated age and no distress   Pelvic: External genitalia:  no lesions and atrophic appearance  Vagina: atrophic appearance, with excoriated area, slight ulceration in right posterior fornix of vagina. Friable with touch of Qtip.Marland Kitchen                 Cervix: absent  Bimanual Exam:  Uterus:  uterus absent                              Adnexa:    normal adnexa in size, nontender and no masses and no masses                             Pessary had been removed by patient at home. Did not check for fit due to vaginal findings.  Assessment :    Enterocele with cystocele with pessary support.   Posterior vaginal wall on right ulceration/excoriation   Normal pelvic exam   Atrophic vaginitis        Plan:    Discussed finding of ulceration and abrasion with patient  and can not use pessary until healed and fit check with pessary. Etiology of ulceration discussed with limited moisture in vagina and KY only for lubrication, not hydration. Questions addressed. Discussed coconut oil  Use to hydrate area with instructions for one week and recheck in office. Patient agreeable. R/O vaginal  Infection  Lab: affirm Pap screening due to appearance. Lab: Pap smear Patient to call if bleeding occurs again.  Rv 1 week   And prn  An After Visit Summary was printed and given to the patient.

## 2017-10-08 LAB — VAGINITIS/VAGINOSIS, DNA PROBE
Candida Species: NEGATIVE
GARDNERELLA VAGINALIS: NEGATIVE
TRICHOMONAS VAG: NEGATIVE

## 2017-10-09 DIAGNOSIS — D485 Neoplasm of uncertain behavior of skin: Secondary | ICD-10-CM | POA: Diagnosis not present

## 2017-10-09 DIAGNOSIS — L821 Other seborrheic keratosis: Secondary | ICD-10-CM | POA: Diagnosis not present

## 2017-10-09 DIAGNOSIS — L570 Actinic keratosis: Secondary | ICD-10-CM | POA: Diagnosis not present

## 2017-10-09 DIAGNOSIS — L814 Other melanin hyperpigmentation: Secondary | ICD-10-CM | POA: Diagnosis not present

## 2017-10-09 DIAGNOSIS — L820 Inflamed seborrheic keratosis: Secondary | ICD-10-CM | POA: Diagnosis not present

## 2017-10-09 DIAGNOSIS — Z85828 Personal history of other malignant neoplasm of skin: Secondary | ICD-10-CM | POA: Diagnosis not present

## 2017-10-09 DIAGNOSIS — D225 Melanocytic nevi of trunk: Secondary | ICD-10-CM | POA: Diagnosis not present

## 2017-10-09 DIAGNOSIS — D2272 Melanocytic nevi of left lower limb, including hip: Secondary | ICD-10-CM | POA: Diagnosis not present

## 2017-10-12 LAB — CYTOLOGY - PAP
Diagnosis: NEGATIVE
HPV: NOT DETECTED

## 2017-10-14 ENCOUNTER — Encounter: Payer: Self-pay | Admitting: Certified Nurse Midwife

## 2017-10-14 ENCOUNTER — Ambulatory Visit (INDEPENDENT_AMBULATORY_CARE_PROVIDER_SITE_OTHER): Payer: Medicare Other | Admitting: Certified Nurse Midwife

## 2017-10-14 VITALS — BP 108/64 | HR 64 | Resp 16 | Wt 159.0 lb

## 2017-10-14 DIAGNOSIS — N765 Ulceration of vagina: Secondary | ICD-10-CM | POA: Diagnosis not present

## 2017-10-14 DIAGNOSIS — N952 Postmenopausal atrophic vaginitis: Secondary | ICD-10-CM | POA: Diagnosis not present

## 2017-10-14 DIAGNOSIS — Z4689 Encounter for fitting and adjustment of other specified devices: Secondary | ICD-10-CM | POA: Diagnosis not present

## 2017-10-14 MED ORDER — ESTROGENS, CONJUGATED 0.625 MG/GM VA CREA: TOPICAL_CREAM | VAGINAL | 1 refills | Status: DC

## 2017-10-14 NOTE — Progress Notes (Signed)
  Subjective:     Patient ID: Erin Day, female   DOB: 1950/12/24, 67 y.o.   MRN: [id number]  Patient here for follow up of pessary related excoriation and ulceration of vagina. She also was noted to have atrophic vaginitis at her previous visit. She had not been seen since 2017 and was managing pessary with daily use with KY jelly only. She relates she has managed with out pessary, but has slower urination, but empties completely. She has an enterocele which the pessary was supporting. Denies any vaginal bleeding or pain. No other concerns today.    Review of Systems  Constitutional: Negative.   HENT: Negative.   Eyes: Negative.   Respiratory: Negative.   Cardiovascular: Negative.   Gastrointestinal: Negative for abdominal pain and constipation.  Endocrine: Negative.   Genitourinary: Negative for difficulty urinating, dyspareunia, dysuria, frequency, genital sores, hematuria, pelvic pain, urgency and vaginal bleeding.  Allergic/Immunologic: Negative.   Neurological: Negative.   Hematological: Negative.   Psychiatric/Behavioral: Negative.        Objective:   Physical Exam  Constitutional: She is oriented to person, place, and time. She appears well-developed and well-nourished.  Abdominal: Soft.  Genitourinary:    Pelvic exam was performed with patient supine. There is no rash, tenderness, lesion or injury on the right labia. There is no rash, tenderness, lesion or injury on the left labia. Right adnexum displays no mass and no tenderness. Left adnexum displays no mass and no tenderness. No vaginal discharge found.  Genitourinary Comments: Uterus, cervix absent  Lymphadenopathy: No inguinal adenopathy noted on the right or left side.  Neurological: She is alert and oriented to person, place, and time.  Skin: Skin is warm and dry.  Psychiatric: Her behavior is normal. Judgment and thought content normal. Cognition and memory are normal.  Vitals reviewed.      Assessment:       Right Vaginal wall ulceration healing Pessary use for enterocele, not re-inserted Voiding with problems Atrophic vaginitis, treating with coconut oil with good results    Plan:     Discussed findings of vaginal wall and continue with coconut oil. Discussed using Premarin cream to area to help with healing. Risks/benefits/warning signs given. Patient agreeable to use. Rx Premarin cream see order with instructions Continue coconut oil the other days 5 days she is not using premarin. Notify if any bleeding or voiding change. Discussed can not use pessary until completely healed. Questions addressed.  Rv 2 weeks

## 2017-10-14 NOTE — Patient Instructions (Signed)
Insert small strip of Premarin Cream twice weekly at bedtime Coconut oil every day in addition to premarin.

## 2017-10-15 ENCOUNTER — Telehealth: Payer: Self-pay

## 2017-10-15 ENCOUNTER — Other Ambulatory Visit: Payer: Self-pay | Admitting: Certified Nurse Midwife

## 2017-10-15 DIAGNOSIS — N952 Postmenopausal atrophic vaginitis: Secondary | ICD-10-CM

## 2017-10-15 MED ORDER — ESTRADIOL 0.1 MG/GM VA CREA: TOPICAL_CREAM | VAGINAL | 1 refills | Status: DC

## 2017-10-15 NOTE — Telephone Encounter (Signed)
Incoming fax from the pharmacy.   " Premarin vaginal cream not covered. Try estradiol cream. "

## 2017-10-15 NOTE — Telephone Encounter (Signed)
Rx sent for Estrace cream

## 2017-10-28 ENCOUNTER — Encounter: Payer: Self-pay | Admitting: Certified Nurse Midwife

## 2017-10-28 ENCOUNTER — Ambulatory Visit (INDEPENDENT_AMBULATORY_CARE_PROVIDER_SITE_OTHER): Payer: Medicare Other | Admitting: Certified Nurse Midwife

## 2017-10-28 ENCOUNTER — Other Ambulatory Visit: Payer: Self-pay

## 2017-10-28 VITALS — BP 120/80 | HR 70 | Resp 16 | Wt 159.0 lb

## 2017-10-28 DIAGNOSIS — N898 Other specified noninflammatory disorders of vagina: Secondary | ICD-10-CM | POA: Diagnosis not present

## 2017-10-28 DIAGNOSIS — T8389XS Other specified complication of genitourinary prosthetic devices, implants and grafts, sequela: Secondary | ICD-10-CM

## 2017-10-28 NOTE — Progress Notes (Signed)
  Subjective:     Patient ID: Erin Day, female   DOB: 03/22/1950, 67 y.o.   MRN: [id number]  67 yo white female here for follow up of vaginal erosion due to pessary use with out moisture or Estrace cream use consistently. Used [employer] for insertion. It has been 5 weeks since area has been treated with Estrace cream twice weekly and coconut oil nightly. No vaginal bleeding or pain. Patient feels vaginally much better. Ready to have pessary back in for support. No problems today.    Review of Systems  Constitutional: Negative.   HENT: Negative.   Eyes: Negative.   Respiratory: Negative.   Cardiovascular: Negative.   Gastrointestinal: Negative.   Genitourinary: Positive for urgency. Negative for difficulty urinating, dyspareunia, dysuria, hematuria, vaginal bleeding, vaginal discharge and vaginal pain.       At times due to pessary not inserted  Musculoskeletal: Negative.   Skin: Negative.   Allergic/Immunologic: Negative.   Neurological: Negative.   Hematological: Negative.   Psychiatric/Behavioral: Negative.        Objective:   Physical Exam  Constitutional: She is oriented to person, place, and time. She appears well-developed and well-nourished.  Abdominal: Soft. There is no tenderness.  Genitourinary: Pelvic exam was performed with patient supine. There is no rash, tenderness, lesion or injury on the right labia. There is no rash, tenderness, lesion or injury on the left labia. Right adnexum displays no mass and no fullness. Left adnexum displays no mass and no fullness. No tenderness or bleeding in the vagina.  Genitourinary Comments: Vagina inspected and area of erosion is shrinking and about 2 cm size of redness and healing tissue. No bleeding or tenderness on exam. Vagina well hydrated with coconut oil and Estrace use. Cystocele noted, no change  Cervix and uterus surgically absent   Lymphadenopathy: No inguinal adenopathy noted on the right or left side.   Neurological: She is alert and oriented to person, place, and time.  Skin: Skin is warm and dry.  Psychiatric: She has a normal mood and affect. Her behavior is normal. Judgment and thought content normal.       Assessment:     Pessary use for vaginal support Vaginal wall erosion from pessary use with moisturizer, area healing and decreasing in size Atrophic vaginitis using Estrace cream and coconut oil with good response    Plan:     Continue above plan as discussed and bring pessary with her at next exam. If healed will consider reinsertion. Advise if any vaginal bleeding.  Rv 3 weeks

## 2017-10-28 NOTE — Progress Notes (Deleted)
ROS  67 y.o. {MARITAL STATUS:22092} {Race/ethnicity:17218} female [id number] here for follow up of ****** treated with ******* initiated on {DATE MONTH DAY TWSF:681275170}. Completed all medication as directed.  Denies any symptoms of**************************.    O: Healthy WD,WN female Affect:  Skin: Abdomen:{Exam; abdomen brief:12273} Pelvic exam:{pe pelvic exam prenatal obgyn:314539}  A:****** Resolved     P: Discussed findings of   Labs   Instructions given regarding:  RV

## 2017-11-13 DIAGNOSIS — Z23 Encounter for immunization: Secondary | ICD-10-CM | POA: Diagnosis not present

## 2017-11-18 ENCOUNTER — Encounter: Payer: Self-pay | Admitting: Certified Nurse Midwife

## 2017-11-18 ENCOUNTER — Other Ambulatory Visit: Payer: Self-pay

## 2017-11-18 ENCOUNTER — Ambulatory Visit (INDEPENDENT_AMBULATORY_CARE_PROVIDER_SITE_OTHER): Payer: Medicare Other | Admitting: Certified Nurse Midwife

## 2017-11-18 VITALS — BP 120/80 | HR 68 | Resp 16 | Wt 154.0 lb

## 2017-11-18 DIAGNOSIS — N952 Postmenopausal atrophic vaginitis: Secondary | ICD-10-CM

## 2017-11-18 DIAGNOSIS — N898 Other specified noninflammatory disorders of vagina: Secondary | ICD-10-CM

## 2017-11-18 DIAGNOSIS — T8389XS Other specified complication of genitourinary prosthetic devices, implants and grafts, sequela: Secondary | ICD-10-CM

## 2017-11-18 NOTE — Progress Notes (Signed)
Review of Systems  Constitutional: Negative.   HENT: Negative.   Eyes: Negative.   Respiratory: Negative.   Cardiovascular: Negative.   Gastrointestinal: Negative.   Genitourinary: Negative.        Denies vaginal bleeding or dryness or pain   Musculoskeletal: Negative.   Skin: Negative.   Neurological: Negative.   Endo/Heme/Allergies: Negative.   Psychiatric/Behavioral: Negative.     [name] here for follow up of vaginal atrophy and excoriation noted from pessary. Bladder prolapse has felt better with treatment with coconut oil and estrogen cream. Hopeful pessary can be replaced soon. No urinary incontinence. Denies any vaginal bleeding or vaginal pain. No issues today.   Physical Exam  Constitutional: She is oriented to person, place, and time. She appears well-developed and well-nourished.  Abdominal: Soft.  Genitourinary: Vagina normal. There is no rash, tenderness or lesion on the right labia. There is no rash, tenderness or lesion on the left labia. No vaginal discharge found.  Genitourinary Comments: Vaginal area of concern with previous ulceration from pessary has healed except for thin scar line noted in posterior fornix to right, slight tenderness with Qtip touch only. Applied patient's coconut oil to area. No bleeding noted  Lymphadenopathy: No inguinal adenopathy noted on the right or left side.  Neurological: She is alert and oriented to person, place, and time.  Skin: Skin is warm and dry.  Psychiatric: She has a normal mood and affect. Her behavior is normal. Judgment and thought content normal.   A: Vaginal ulceration follow up , healing well No areas of concern today Atrophic vaginitis treating with Premarin cream and coconut oil  P: Will plan to insert pessary at next visit if area not present. Continue treatment as discussed with Premarin and coconut oil . Notify if issues or bleeding.  Rv 3 weeks, prn

## 2017-12-07 DIAGNOSIS — I10 Essential (primary) hypertension: Secondary | ICD-10-CM | POA: Diagnosis not present

## 2017-12-07 DIAGNOSIS — Z6827 Body mass index (BMI) 27.0-27.9, adult: Secondary | ICD-10-CM | POA: Diagnosis not present

## 2017-12-07 DIAGNOSIS — E663 Overweight: Secondary | ICD-10-CM | POA: Diagnosis not present

## 2017-12-07 DIAGNOSIS — E038 Other specified hypothyroidism: Secondary | ICD-10-CM | POA: Diagnosis not present

## 2017-12-07 DIAGNOSIS — Z1389 Encounter for screening for other disorder: Secondary | ICD-10-CM | POA: Diagnosis not present

## 2017-12-07 DIAGNOSIS — F419 Anxiety disorder, unspecified: Secondary | ICD-10-CM | POA: Diagnosis not present

## 2017-12-07 DIAGNOSIS — N3941 Urge incontinence: Secondary | ICD-10-CM | POA: Diagnosis not present

## 2017-12-09 ENCOUNTER — Encounter: Payer: Self-pay | Admitting: Certified Nurse Midwife

## 2017-12-09 ENCOUNTER — Ambulatory Visit (INDEPENDENT_AMBULATORY_CARE_PROVIDER_SITE_OTHER): Payer: Medicare Other | Admitting: Certified Nurse Midwife

## 2017-12-09 ENCOUNTER — Other Ambulatory Visit: Payer: Self-pay

## 2017-12-09 VITALS — BP 118/78 | HR 68 | Resp 16 | Wt 150.0 lb

## 2017-12-09 DIAGNOSIS — N8110 Cystocele, unspecified: Secondary | ICD-10-CM

## 2017-12-09 DIAGNOSIS — Z4689 Encounter for fitting and adjustment of other specified devices: Secondary | ICD-10-CM | POA: Diagnosis not present

## 2017-12-09 DIAGNOSIS — N898 Other specified noninflammatory disorders of vagina: Secondary | ICD-10-CM | POA: Diagnosis not present

## 2017-12-09 DIAGNOSIS — T8389XD Other specified complication of genitourinary prosthetic devices, implants and grafts, subsequent encounter: Secondary | ICD-10-CM | POA: Diagnosis not present

## 2017-12-09 NOTE — Progress Notes (Signed)
  Subjective:     Patient ID: Erin Day, female   DOB: 30-Jun-1950, 67 y.o.   MRN: [id number]  67 yo white female here for follow up of vaginal erosion due to pessary use. Has been using coconut oil and estrace cream twice weekly to help with healing and for vaginal dryness. Denies vaginal bleeding or pelvic pain. Brought pessary with her.    Review of Systems  Constitutional: Negative.   HENT: Negative.        Has sinusitis  Eyes: Negative.   Respiratory: Negative.   Cardiovascular: Negative.   Gastrointestinal: Negative.   Endocrine: Negative.   Genitourinary: Negative.        Pelvic pressure from pessary being out. Slower urination at times  Musculoskeletal: Negative.   Skin: Negative.   Allergic/Immunologic: Negative.   Neurological: Negative.   Hematological: Negative.   Psychiatric/Behavioral: Negative.        Objective:   Physical Exam  Constitutional: She is oriented to person, place, and time. She appears well-developed and well-nourished.  Abdominal: Soft. There is no tenderness.  Genitourinary: Vagina normal. Pelvic exam was performed with patient supine. There is rash on the right labia. There is no tenderness, lesion or injury on the right labia. There is rash on the left labia. There is no tenderness, lesion or injury on the left labia. Right adnexum displays no mass, no tenderness and no fullness. Left adnexum displays no mass, no tenderness and no fullness. No tenderness or bleeding in the vagina. No vaginal discharge found.  Genitourinary Comments: Moisture noted in vagina, erosion area has completely healed with no redness or weeping noted on right posterior area of vagina, non tender  Labia  Fine macular rash from shaving in last 24 hours, no pustules or ingrown hairs noted, non tender  Cervix/uterus surgically absent  Lymphadenopathy: No inguinal adenopathy noted on the right or left side.  Neurological: She is alert and oriented to person, place, and  time.  Skin: Skin is warm.  Psychiatric: She has a normal mood and affect. Her speech is normal and behavior is normal. Judgment and thought content normal. Cognition and memory are normal.  # 2 ring pessary tried and not support, inserted # 3 dish pessary with support with good fit noted. Patient walked, sat and voided with pessary remaining in appropriate place with no discomfort. Pessary removed by patient with no problems.     Assessment:     Normal pelvic exam History of vaginal wall erosion from pessary use, now healed Cystocele grade 3 desires pessary support  Pessary fitting    Plan:     Discussed findings of normal pelvic exam and healed vaginal wall area. Discussed current pessary is not an option due to previous occurrence. Patient was fitted with #3  Dish pessary with support with good fit noted. Pessary will be ordered and patient will be called when in.  Continue Estrace cream use twice weekly and coconut oil twice weekly as needed. Questions addressed.

## 2017-12-29 ENCOUNTER — Ambulatory Visit (INDEPENDENT_AMBULATORY_CARE_PROVIDER_SITE_OTHER): Payer: Medicare Other | Admitting: Certified Nurse Midwife

## 2017-12-29 ENCOUNTER — Other Ambulatory Visit: Payer: Self-pay

## 2017-12-29 ENCOUNTER — Encounter: Payer: Self-pay | Admitting: Certified Nurse Midwife

## 2017-12-29 VITALS — BP 120/80 | HR 70 | Resp 16 | Wt 146.0 lb

## 2017-12-29 DIAGNOSIS — N8110 Cystocele, unspecified: Secondary | ICD-10-CM | POA: Diagnosis not present

## 2017-12-29 DIAGNOSIS — Z4689 Encounter for fitting and adjustment of other specified devices: Secondary | ICD-10-CM

## 2017-12-29 NOTE — Progress Notes (Signed)
Subjective:   67 y.o. Divorced White female [id number] here for pessary dispense and insertion.  Patient had been using following pessary style and size: # 2 ring pessary and using KY jelly for insertion. She had developed bleeding and subsequently was seen and vaginal erosion was noted vagina. Pessary use was stopped until healed and coconut oil use for moisture now is being used. Vaginal area was healed on last visit and new pessary fitting was done. She is here to have inserted and to insert/remove herself instructions.    Use of protective clothing such as Depends or pads No.. .  Constipation issues no.  She is not sexually active.     ROS:   no breast pain or new or enlarging lumps on self exam,  no abnormal bleeding, pelvic pain or discharge, no dysuria, trouble voiding or hematuria  Compliant use of coconut oil since last visit and brought with her today.  General Exam:    BP 120/80   Pulse 70   Resp 16   Wt 146 lb (66.2 kg)   LMP 08/14/1991 (Approximate)   BMI 26.92 kg/m   General appearance: alert, cooperative, appears stated age, no distress and excited to have pessary support again.   Pelvic: External genitalia:  no lesions and well estrogenized                Urethra: normal appearing urethra with no masses, tenderness or lesions              Vagina: normal appearing vagina with normal color and discharge, no lesions, area of erosion totally healed. No redness or tenderness..               Cervix:absent surgically  Bimanual Exam:  Uterus: absent                               Adnexa:    No fullness, tenderness or masses noted                             New # 3 incontinence dish with knob inserted. Pessary was inserted without difficulty. Patient sat, squatted stood and pessary felt normal. Patient removed pessary and re-inserted pessary with provider checking insertion and found in appropriate position.  Assessment :    Normal pelvic exam    Cystocele grade 2-3   Appropriate  fit with new pessary   Atrophic vaginitis using coconut oil for moisture   History of vaginal erosion with ring pessary use and Ky jelly for moisture.    Plan:   Instructions for use and removal and reinsertion . Patient feels comfortable with new pessary. Return in 2 weeks to recheck, she will insert and remove as she usually does with moisture used. If vaginal bleeding or pain will need to advise.       Rv as above, prn  An After Visit Summary was printed and given to the patient.

## 2017-12-31 ENCOUNTER — Telehealth: Payer: Self-pay | Admitting: Certified Nurse Midwife

## 2017-12-31 NOTE — Telephone Encounter (Signed)
Call to patient. Message given to patient as seen below from Melvia Heaps, CNM and patient verbalized understanding. Patient has recheck appointment scheduled for 01-12-18.   Encounter closed.

## 2017-12-31 NOTE — Telephone Encounter (Signed)
Patient calling about new pessary. Patient loves it, but it does come out with bowel movements. Believes it may be the wrong size and would like to speak with a nurse. OK to leave detailed message if patient does not answer return call.

## 2017-12-31 NOTE — Telephone Encounter (Signed)
This can happen with straining, would recommend just pushing up inside prior to bowel movement or can remove,either is acceptable.

## 2017-12-31 NOTE — Telephone Encounter (Signed)
Call to patient. Patient states that she "absolutely loves her new pessary, but it comes out with bowel movements." Patient states she is willing to just take the pessary out when she needs to have a BM, but was told to call and give an update today. Patient denies pain with pessary. Patient is scheduled for recheck of pessary on 01-12-18. RN offered OV sooner, but patient declined stating she is okay to wait until her scheduled appointment. RN advised would updated Melvia Heaps, CNM. Patient agreeable.   Routing to provider and will close encounter.

## 2018-01-12 ENCOUNTER — Other Ambulatory Visit: Payer: Self-pay

## 2018-01-12 ENCOUNTER — Ambulatory Visit (INDEPENDENT_AMBULATORY_CARE_PROVIDER_SITE_OTHER): Payer: Medicare Other | Admitting: Certified Nurse Midwife

## 2018-01-12 ENCOUNTER — Encounter: Payer: Self-pay | Admitting: Certified Nurse Midwife

## 2018-01-12 VITALS — BP 118/78 | HR 70 | Resp 16 | Wt 145.0 lb

## 2018-01-12 DIAGNOSIS — N8110 Cystocele, unspecified: Secondary | ICD-10-CM | POA: Diagnosis not present

## 2018-01-12 DIAGNOSIS — Z4689 Encounter for fitting and adjustment of other specified devices: Secondary | ICD-10-CM

## 2018-01-12 NOTE — Progress Notes (Signed)
Subjective:   67 y.o. DivorcedWhite female [id number] here for pessary check.  Patient has been using following pessary style and size:  #3 incontinence dish with knob for the past two weeks..  She describes the following issues with the pessary: feels like it might come out with straining with bowel movement only. Denies vaginal bleeding or vaginal soreness as she noted before and vaginal bladder support with complete emptying is great.   Use of protective clothing such as Depends or pads No.  Constipation issues with use of pessary No..  She is not sexually active.     ROS:   no breast pain or new or enlarging lumps on self exam, no vaginal bleeding, no discharge or pelvic pain, no dysuria, trouble voiding or hematuria   no dysuria, trouble voiding or hematuria. Compliant to use of vaginal cream Yes.   General Exam:    BP 118/78   Pulse 70   Resp 16   Wt 145 lb (65.8 kg)   LMP 08/14/1991 (Approximate)   BMI 26.74 kg/m   General appearance: alert, cooperative, appears stated age and no distress   Pelvic: External genitalia:  no lesions and normal appearance   Before pessary was removed No. prolapse over the pessary   In correct position Yes.                Urethra: normal appearing urethra with no masses, tenderness or lesions              Vagina: normal appearing vagina with normal color and discharge, no lesions, grade 2-3 cystocele.  There are No abrasions or ulcerations.               Cervix: absent    Bimanual Exam:  Uterus:absent                               Adnexa:     nontender and no masses or fullness                             Pessary was removed without difficulty without using forceps.  Pessary was cleansed with Betadine.  Pessary was replaced. Patient tolerated procedure well.    Assessment :  Normal exam,    Cystocele grade  2-3 symptomatic       No ulcerations or abrasions with use of new pessary and good fit   Use of pessary continued   Plan:     Discussed  normal finding of vagina and coconut oil working well. Would remove every 2 weeks or as comfortable for her.  Stressed importance of coconut oil use for moisture and for reinsertion of pessary after removal and cleaning. Needs to advise if vaginal bleeding or pain as before.  Rv 3 months, prn          An After Visit Summary was printed and given to the patient.

## 2018-01-12 NOTE — Patient Instructions (Signed)
Continue with coconut oil twice weekly and as needed. If any change come in.

## 2018-02-26 ENCOUNTER — Other Ambulatory Visit: Payer: Self-pay | Admitting: Internal Medicine

## 2018-02-26 ENCOUNTER — Ambulatory Visit
Admission: RE | Admit: 2018-02-26 | Discharge: 2018-02-26 | Disposition: A | Discharge status: Home / Self Care | Payer: Medicare Other | Source: Ambulatory Visit | Attending: Internal Medicine | Admitting: Internal Medicine

## 2018-02-26 DIAGNOSIS — R1032 Left lower quadrant pain: Secondary | ICD-10-CM

## 2018-02-26 DIAGNOSIS — Z6826 Body mass index (BMI) 26.0-26.9, adult: Secondary | ICD-10-CM | POA: Diagnosis not present

## 2018-02-26 DIAGNOSIS — I10 Essential (primary) hypertension: Secondary | ICD-10-CM | POA: Diagnosis not present

## 2018-02-26 DIAGNOSIS — K5730 Diverticulosis of large intestine without perforation or abscess without bleeding: Secondary | ICD-10-CM | POA: Diagnosis not present

## 2018-02-26 MED ORDER — IOPAMIDOL (ISOVUE-300) INJECTION 61%
100.0000 mL | Freq: Once | INTRAVENOUS | Status: AC | PRN
  Administered 2018-02-26: 100 mL via INTRAVENOUS

## 2018-06-04 DIAGNOSIS — M859 Disorder of bone density and structure, unspecified: Secondary | ICD-10-CM | POA: Diagnosis not present

## 2018-06-04 DIAGNOSIS — E7849 Other hyperlipidemia: Secondary | ICD-10-CM | POA: Diagnosis not present

## 2018-06-04 DIAGNOSIS — E038 Other specified hypothyroidism: Secondary | ICD-10-CM | POA: Diagnosis not present

## 2018-06-09 DIAGNOSIS — R809 Proteinuria, unspecified: Secondary | ICD-10-CM | POA: Diagnosis not present

## 2018-06-09 DIAGNOSIS — R82998 Other abnormal findings in urine: Secondary | ICD-10-CM | POA: Diagnosis not present

## 2018-06-11 DIAGNOSIS — Z960 Presence of urogenital implants: Secondary | ICD-10-CM | POA: Diagnosis not present

## 2018-06-11 DIAGNOSIS — M8580 Other specified disorders of bone density and structure, unspecified site: Secondary | ICD-10-CM | POA: Diagnosis not present

## 2018-06-11 DIAGNOSIS — E785 Hyperlipidemia, unspecified: Secondary | ICD-10-CM | POA: Diagnosis not present

## 2018-06-11 DIAGNOSIS — E039 Hypothyroidism, unspecified: Secondary | ICD-10-CM | POA: Diagnosis not present

## 2018-06-11 DIAGNOSIS — Z0000 Encounter for general adult medical examination without abnormal findings: Secondary | ICD-10-CM | POA: Diagnosis not present

## 2018-06-11 DIAGNOSIS — K5790 Diverticulosis of intestine, part unspecified, without perforation or abscess without bleeding: Secondary | ICD-10-CM | POA: Diagnosis not present

## 2018-06-11 DIAGNOSIS — F419 Anxiety disorder, unspecified: Secondary | ICD-10-CM | POA: Diagnosis not present

## 2018-06-11 DIAGNOSIS — J309 Allergic rhinitis, unspecified: Secondary | ICD-10-CM | POA: Diagnosis not present

## 2018-06-11 DIAGNOSIS — Z1331 Encounter for screening for depression: Secondary | ICD-10-CM | POA: Diagnosis not present

## 2018-06-11 DIAGNOSIS — E663 Overweight: Secondary | ICD-10-CM | POA: Diagnosis not present

## 2018-06-11 DIAGNOSIS — I10 Essential (primary) hypertension: Secondary | ICD-10-CM | POA: Diagnosis not present

## 2018-06-11 DIAGNOSIS — Z1339 Encounter for screening examination for other mental health and behavioral disorders: Secondary | ICD-10-CM | POA: Diagnosis not present

## 2018-06-11 DIAGNOSIS — K589 Irritable bowel syndrome without diarrhea: Secondary | ICD-10-CM | POA: Diagnosis not present

## 2018-06-11 DIAGNOSIS — M1990 Unspecified osteoarthritis, unspecified site: Secondary | ICD-10-CM | POA: Diagnosis not present

## 2018-07-01 ENCOUNTER — Other Ambulatory Visit: Payer: Self-pay | Admitting: Internal Medicine

## 2018-07-01 DIAGNOSIS — E785 Hyperlipidemia, unspecified: Secondary | ICD-10-CM

## 2018-07-23 ENCOUNTER — Other Ambulatory Visit: Payer: Self-pay | Admitting: Internal Medicine

## 2018-07-23 DIAGNOSIS — Z1231 Encounter for screening mammogram for malignant neoplasm of breast: Secondary | ICD-10-CM

## 2018-09-07 ENCOUNTER — Ambulatory Visit: Payer: Medicare Other

## 2018-10-01 DIAGNOSIS — Z23 Encounter for immunization: Secondary | ICD-10-CM | POA: Diagnosis not present

## 2018-10-14 DIAGNOSIS — Z85828 Personal history of other malignant neoplasm of skin: Secondary | ICD-10-CM | POA: Diagnosis not present

## 2018-10-14 DIAGNOSIS — L821 Other seborrheic keratosis: Secondary | ICD-10-CM | POA: Diagnosis not present

## 2018-10-14 DIAGNOSIS — L814 Other melanin hyperpigmentation: Secondary | ICD-10-CM | POA: Diagnosis not present

## 2018-10-14 DIAGNOSIS — L570 Actinic keratosis: Secondary | ICD-10-CM | POA: Diagnosis not present

## 2018-10-14 DIAGNOSIS — D225 Melanocytic nevi of trunk: Secondary | ICD-10-CM | POA: Diagnosis not present

## 2018-10-14 DIAGNOSIS — D1801 Hemangioma of skin and subcutaneous tissue: Secondary | ICD-10-CM | POA: Diagnosis not present

## 2018-10-14 DIAGNOSIS — D2272 Melanocytic nevi of left lower limb, including hip: Secondary | ICD-10-CM | POA: Diagnosis not present

## 2018-10-20 ENCOUNTER — Other Ambulatory Visit: Payer: Self-pay

## 2018-10-22 ENCOUNTER — Other Ambulatory Visit: Payer: Self-pay

## 2018-10-22 ENCOUNTER — Ambulatory Visit (INDEPENDENT_AMBULATORY_CARE_PROVIDER_SITE_OTHER): Payer: Medicare Other | Admitting: Certified Nurse Midwife

## 2018-10-22 ENCOUNTER — Encounter: Payer: Self-pay | Admitting: Certified Nurse Midwife

## 2018-10-22 VITALS — BP 118/76 | HR 70 | Temp 97.2°F | Resp 16 | Ht 61.5 in | Wt 144.0 lb

## 2018-10-22 DIAGNOSIS — Z124 Encounter for screening for malignant neoplasm of cervix: Secondary | ICD-10-CM

## 2018-10-22 DIAGNOSIS — N952 Postmenopausal atrophic vaginitis: Secondary | ICD-10-CM

## 2018-10-22 DIAGNOSIS — N899 Noninflammatory disorder of vagina, unspecified: Secondary | ICD-10-CM

## 2018-10-22 DIAGNOSIS — Z01411 Encounter for gynecological examination (general) (routine) with abnormal findings: Secondary | ICD-10-CM

## 2018-10-22 DIAGNOSIS — N898 Other specified noninflammatory disorders of vagina: Secondary | ICD-10-CM

## 2018-10-22 DIAGNOSIS — Z113 Encounter for screening for infections with a predominantly sexual mode of transmission: Secondary | ICD-10-CM | POA: Diagnosis not present

## 2018-10-22 DIAGNOSIS — Z4689 Encounter for fitting and adjustment of other specified devices: Secondary | ICD-10-CM

## 2018-10-22 MED ORDER — ESTRADIOL 0.1 MG/GM VA CREA: TOPICAL_CREAM | VAGINAL | 3 refills | Status: DC

## 2018-10-22 NOTE — Patient Instructions (Signed)

## 2018-10-22 NOTE — Progress Notes (Signed)
68 y.o. [id number] Divorced  Caucasian Fe here for annual exam. Post menopausal denies vaginal dryness using coconut oil. Dish pessary working well for cystocele and empties so much better. Using coconut oil for lubrication working well. Continues with Estrace vaginal cream twice weekly, no issues. Has a boyfriend now after 25 years!! Old friend from high school.  Sees PCP Dr. Virgina Jock, had labs and virtual exam all normal. Has lost 40 pounds over there past 2-3 years. No health issues today.  Patient's last menstrual period was 08/14/1991 (approximate).          Sexually active: Yes.    The current method of family planning is status post hysterectomy.    Exercising: Yes.    walking & stretching Smoker:  no  Review of Systems  Constitutional: Negative.   HENT: Negative.   Eyes: Negative.   Respiratory: Negative.   Cardiovascular: Negative.   Gastrointestinal: Negative.   Genitourinary: Negative.   Musculoskeletal: Negative.   Skin: Negative.   Neurological: Negative.   Endo/Heme/Allergies: Negative.   Psychiatric/Behavioral: Negative.     Health Maintenance: Pap:  10-07-17 neg HPV HR neg History of Abnormal Pap: yes, yrs ago MMG:  07-13-17 category b density birads 1:neg, scheduled for next week Self Breast exams: yes Colonoscopy:  2014 normal BMD:   2017 dr Virgina Jock TDaP:  2013 Shingles: 2014 Pneumonia: 2017  Hep C and HIV: declined, discussed with Dr Virgina Jock Labs:  With PCP   reports that she quit smoking about 46 years ago. Her smoking use included cigarettes. She has a 0.75 pack-year smoking history. She has never used smokeless tobacco. She reports current alcohol use of about 1.0 - 2.0 standard drinks of alcohol per week. She reports that she does not use drugs.  Past Medical History:  Diagnosis Date  . Allergy   . Arthritis   . Enterocele    of vaginal vault  . Hyperlipidemia 1998  . Hypertension   . Hypothyroidism 2008    Past Surgical History:  Procedure Laterality Date   . ABDOMINAL HYSTERECTOMY  1993   ovaries remain, due to fibroids  . LAPAROSCOPIC CHOLECYSTECTOMY  2004  . SKIN CANCER EXCISION Left 06/2012   squamous cell left shoulder  . TONSILLECTOMY  1968    Current Outpatient Medications  Medication Sig Dispense Refill  . Cholecalciferol (D3-1000) 25 MCG (1000 UT) capsule     . estradiol (ESTRACE) 0.1 MG/GM vaginal cream Use 1/2 g vaginally 2 times per week 42.5 g 1  . irbesartan (AVAPRO) 150 MG tablet irbesartan 150 mg tablet  TAKE 1 TABLET BY MOUTH EVERY DAY    . levothyroxine (SYNTHROID, LEVOTHROID) 88 MCG tablet Take 88 mcg by mouth daily before breakfast.    . Magnesium 250 MG TABS     . Omega-3 Fatty Acids (FISH OIL PO)     . Probiotic Product (PROBIOTIC PO)     . Turmeric Curcumin 500 MG CAPS     . vitamin C (ASCORBIC ACID) 500 MG tablet Take 1,000 mg by mouth daily.      No current facility-administered medications for this visit.     Family History  Problem Relation Age of Onset  . Hypertension Father   . Dementia Father   . Heart failure Mother   . Hyperlipidemia Mother   . Breast cancer Maternal Aunt   . Heart failure Paternal Uncle   . Breast cancer Maternal Grandmother   . Diabetes Maternal Grandmother   . Multiple births Paternal Grandmother   .  Colon cancer Neg Hx     ROS:  Pertinent items are noted in HPI.  Otherwise, a comprehensive ROS was negative.  Exam:   BP 118/76   Pulse 70   Temp (!) 97.2 F (36.2 C) (Skin)   Resp 16   Ht 5' 1.5" (1.562 m)   Wt 144 lb (65.3 kg)   LMP 08/14/1991 (Approximate)   BMI 26.77 kg/m  Height: 5' 1.5" (156.2 cm) Ht Readings from Last 3 Encounters:  10/22/18 5' 1.5" (1.562 m)  10/07/17 5' 1.75" (1.568 m)  09/07/15 5' 1.75" (1.568 m)    General appearance: alert, cooperative and appears stated age Head: Normocephalic, without obvious abnormality, atraumatic Neck: no adenopathy, supple, symmetrical, trachea midline and thyroid normal to inspection and palpation Lungs:  clear to auscultation bilaterally Breasts: normal appearance, no masses or tenderness, No nipple retraction or dimpling, No nipple discharge or bleeding, No axillary or supraclavicular adenopathy Heart: regular rate and rhythm Abdomen: soft, non-tender; no masses,  no organomegaly Extremities: extremities normal, atraumatic, no cyanosis or edema Skin: Skin color, texture, turgor normal. No rashes or lesions Lymph nodes: Cervical, supraclavicular, and axillary nodes normal. No abnormal inguinal nodes palpated Neurologic: Grossly normal   Pelvic: External genitalia:  no lesions              Urethra:  normal appearing urethra with no masses, tenderness or lesions              Bartholin's and Skene's: normal                 Vagina: normal appearing vagina with normal color and discharge, no lesions, moist, on left posterior fornix small 4cm area of excoriation, bulls eye appearance, no broken skin from pessary use. Slightly tender.              Cervix: absent              Pap taken: No. Bimanual Exam:  Uterus:  uterus absent              Adnexa: normal adnexa and no mass, fullness, tenderness               Rectovaginal: Confirms               Anus:  normal sphincter tone, no lesions  Chaperone present: yes  A:  Well Woman with normal exam  Post menopausal with TAH ovaries retained  Dish Pessary use for grade 3 cystocele with out problems(patient inserts and removes)  Atrophic vaginitis using Premarin cream, no issues  Hypertension, Hypothyroid management with PCP  Screening labs    P:   Reviewed health and wellness pertinent to exam  Discussed vaginal findings and need to treat area with Premarin cream when she removes pessary twice weekly. Also leave pessary out if feasible and emptying bladder well when possible. Patient agreeable has done before. Warning signs with bleeding given.  Recheck 2 weeks.   Risks/benefits/warning signs with Premarin cream given.  Rx Premarin cream see  order with instructions  Continue follow up with PCP as indicated.  Labs: affirm, Gc/Chlamydia  Pap smear: no   counseled on breast self exam, mammography screening, STD prevention, HIV risk factors and prevention, adequate intake of calcium and vitamin D, diet and exercise  return annually or prn  An After Visit Summary was printed and given to the patient.

## 2018-10-23 LAB — VAGINITIS/VAGINOSIS, DNA PROBE
Candida Species: NEGATIVE
Gardnerella vaginalis: NEGATIVE
Trichomonas vaginosis: NEGATIVE

## 2018-10-25 LAB — GC/CHLAMYDIA PROBE AMP
Chlamydia trachomatis, NAA: NEGATIVE
Neisseria Gonorrhoeae by PCR: NEGATIVE

## 2018-10-27 ENCOUNTER — Other Ambulatory Visit: Payer: Self-pay

## 2018-10-27 ENCOUNTER — Ambulatory Visit
Admission: RE | Admit: 2018-10-27 | Discharge: 2018-10-27 | Disposition: A | Discharge status: Home / Self Care | Payer: Medicare Other | Source: Ambulatory Visit | Attending: Internal Medicine | Admitting: Internal Medicine

## 2018-10-27 DIAGNOSIS — Z1231 Encounter for screening mammogram for malignant neoplasm of breast: Secondary | ICD-10-CM | POA: Diagnosis not present

## 2018-11-04 ENCOUNTER — Other Ambulatory Visit: Payer: Self-pay

## 2018-11-05 NOTE — Progress Notes (Signed)
Review of Systems  Constitutional: Negative.   HENT: Negative.   Eyes: Negative.   Respiratory: Negative.   Cardiovascular: Negative.   Gastrointestinal: Negative.   Genitourinary: Negative.        Frequency of urination when pessary only  Musculoskeletal: Negative.   Skin: Negative.   Neurological: Negative.   Endo/Heme/Allergies: Negative.   Psychiatric/Behavioral: Negative.     68 y.o. Divorced Caucasian female G3P0012 here for follow up of tissue irritation from pessary. Has not had pessary in except for one day only. She is using Coconut oil  vaginally every night. Using Estrace cream  twice weekly as prescribed.   Denies any symptoms of vaginal bleeding, pain or urinary incontinence. More urgency with pessary not in to help with cystocele only. No other health issues today.    O: Healthy WD,WN female Affect: normal, orientation x 3 Skin:warm and dry Abdomen:soft, non tender Pelvic exam:EXTERNAL GENITALIA: normal appearing vulva with no masses, tenderness or lesions, slight atrophic appearance VAGINA: Grade 3 cystocele noted no abnormal discharge or lesions and moist,non tender, area of concern on right has resolved,and area noted on left  no bullseye appearance, redness or thinning noted. No other areas of concern noted with inspection. CERVIX/UTERUS: surgically absent ADNEXA: no masses palpable and nontender RECTUM: External appearance normal, no lesions  A:Grade 3 cystocele with dish pessary with support use, with excoriation noted at aex, has resolved. Normal vaginal appearance, good moisture.   P: Discussed findings of vagina and area of concern. Discussed continued coconut oil use nightly and Estrace cream twice weekly. Do not use pessary for prolonged use with removal and cleaning. Patient aware to call if vaginal bleeding or pain or urinary symptoms increase.. Questions addressed.   RV prn.

## 2018-11-08 ENCOUNTER — Encounter: Payer: Self-pay | Admitting: Certified Nurse Midwife

## 2018-11-08 ENCOUNTER — Other Ambulatory Visit: Payer: Self-pay

## 2018-11-08 ENCOUNTER — Ambulatory Visit (INDEPENDENT_AMBULATORY_CARE_PROVIDER_SITE_OTHER): Payer: Medicare Other | Admitting: Certified Nurse Midwife

## 2018-11-08 VITALS — BP 122/80 | HR 70 | Temp 97.1°F | Resp 16 | Wt 146.0 lb

## 2018-11-08 DIAGNOSIS — T8389XS Other specified complication of genitourinary prosthetic devices, implants and grafts, sequela: Secondary | ICD-10-CM | POA: Diagnosis not present

## 2018-11-08 DIAGNOSIS — N898 Other specified noninflammatory disorders of vagina: Secondary | ICD-10-CM | POA: Diagnosis not present

## 2018-11-08 DIAGNOSIS — Z01419 Encounter for gynecological examination (general) (routine) without abnormal findings: Secondary | ICD-10-CM

## 2019-04-05 ENCOUNTER — Encounter: Payer: Self-pay | Admitting: Certified Nurse Midwife

## 2019-05-12 ENCOUNTER — Other Ambulatory Visit: Payer: Self-pay | Admitting: Internal Medicine

## 2019-05-12 DIAGNOSIS — Z1231 Encounter for screening mammogram for malignant neoplasm of breast: Secondary | ICD-10-CM

## 2019-05-24 ENCOUNTER — Telehealth: Payer: Self-pay

## 2019-05-24 NOTE — Telephone Encounter (Signed)
Patient is calling to discuss suppositories. Patient states she has a pessary and has had thinning issues in the past. Patient states she is currently using coconut oil and would like to discuss Evaree as a suppository instead.

## 2019-05-24 NOTE — Progress Notes (Signed)
GYNECOLOGY  VISIT  CC:   Vaginal dryness  HPI: 69 y.o. G3P0012 Divorced White or Caucasian female here for vaginal dryness.  She has a dish pessary that was changed from an incontinence ring 12/19 due to vaginal erosion that resolved after several months.  Pt removed pessary four times a week and leaves out overnight.  She uses a small amount of estrogen cream twice weekly and then frozen coconut oil balls several times a week.  Does still feel dryness.  Denies vaginal bleeding or discharge.  Feels the pessary maintenance is requiring a lot of maintenance.  Wonders if there are any changes that can be made.  Also has purchased hyaluronic acid suppositories vaginally but wants to know is ok to use with her pessary because it says in the package not to use with rubber.  Pt advised pessary is silicone and this is fine.    GYNECOLOGIC HISTORY: Patient's last menstrual period was 08/14/1991 (approximate). Contraception: hysterectomy Menopausal hormone therapy: estrace vaginal cream  Patient Active Problem List   Diagnosis Date Noted  . Sensorineural hearing loss (SNHL), bilateral 06/25/2017  . Tinnitus, bilateral 06/25/2017  . Essential hypertension 09/07/2015  . Prolapse of vaginal vault after hysterectomy 09/07/2015  . Osteopenia 09/07/2015    Past Medical History:  Diagnosis Date  . Abnormal Pap smear of cervix   . Allergy   . Arthritis   . Enterocele    of vaginal vault  . Hyperlipidemia 1998  . Hypertension   . Hypothyroidism 2008    Past Surgical History:  Procedure Laterality Date  . ABDOMINAL HYSTERECTOMY  1993   ovaries remain, due to fibroids  . COLPOSCOPY    . LAPAROSCOPIC CHOLECYSTECTOMY  2004  . SKIN CANCER EXCISION Left 06/2012   squamous cell left shoulder  . TONSILLECTOMY  1968    MEDS:   Current Outpatient Medications on File Prior to Visit  Medication Sig Dispense Refill  . Cholecalciferol (D3-1000) 25 MCG (1000 UT) capsule     . estradiol (ESTRACE) 0.1  MG/GM vaginal cream Use 1/2 g vaginally 2 times per week 42.5 g 3  . irbesartan (AVAPRO) 150 MG tablet irbesartan 150 mg tablet  TAKE 1 TABLET BY MOUTH EVERY DAY    . levothyroxine (SYNTHROID, LEVOTHROID) 88 MCG tablet Take 88 mcg by mouth daily before breakfast.    . Magnesium 250 MG TABS     . Omega-3 Fatty Acids (FISH OIL PO)     . Probiotic Product (PROBIOTIC PO)     . Turmeric Curcumin 500 MG CAPS     . vitamin C (ASCORBIC ACID) 500 MG tablet Take 1,000 mg by mouth daily.      No current facility-administered medications on file prior to visit.    ALLERGIES: Codeine and Morphine and related  Family History  Problem Relation Age of Onset  . Hypertension Father   . Dementia Father   . Heart failure Mother   . Hyperlipidemia Mother   . Breast cancer Maternal Aunt   . Heart failure Paternal Uncle   . Breast cancer Maternal Grandmother   . Diabetes Maternal Grandmother   . Multiple births Paternal Grandmother   . Colon cancer Neg Hx     SH:  Divorced, non smoker  Review of Systems  Constitutional: Negative.   HENT: Negative.   Eyes: Negative.   Respiratory: Negative.   Cardiovascular: Negative.   Gastrointestinal: Negative.   Endocrine: Negative.   Genitourinary:       Vaginal dryness  Musculoskeletal: Negative.   Skin: Negative.   Allergic/Immunologic: Negative.   Neurological: Negative.   Psychiatric/Behavioral: Negative.     PHYSICAL EXAMINATION:    BP 114/70   Pulse 70   Temp (!) 97 F (36.1 C) (Skin)   Resp 16   Wt 153 lb (69.4 kg)   LMP 08/14/1991 (Approximate)   BMI 28.44 kg/m     General appearance: alert, cooperative and appears stated age Lymph:  no inguinal LAD noted  Pelvic: External genitalia:  Smooth, non tender, cystic possible fatty nodule just to left of introitus, possible lipoma              Urethra:  normal appearing urethra with no masses, tenderness or lesions              Bartholins and Skenes: normal                 Vagina: mild  erythema at vaginal apex with vault prolapse and 3rd degree cystocele, c/w pessary pressure, no actual ulceration present,  No discharge or blood noted, no lesions              Cervix: absent              Bimanual Exam:  Uterus:  uterus absent              Adnexa: no mass, fullness, tenderness  Chaperone, Royal Hawthorn, CMA, was present for exam.  Assessment: Vault prolapse Cystocele Pessary use H/o vaginal erosion from pessary Vulvar lesion, possible lipoma, benign in appearance H/o TAH in her 40's  Plan: Pt is going to start using her estradiol cream differently, 1gm pv twice weekly.  Rx to [facility] pharmacy with good rx coupon given to reduce cost.  Pt understands how this works.  She will place this when pessary is removed overnight twice weekly. She is going to stop using the coconut oil for now and not use the hyaluronic acid suppositories.  Pt knows to call if has increased discharge or any blood.  Recheck AEX or sooner with any new issues   About 20 minutes total spent with pt discussing exam findings, options for treatment, pros/cons/risks of estrogen vs non-hormonal methods.

## 2019-05-24 NOTE — Telephone Encounter (Signed)
AEX 10/22/2018 with DL  OV 11/08/2018 PMP, Estrace twice weekly H/o cystocele grade 3 Pessary dish # 3 with knob  Spoke with pt. Pt reports having some vaginal dryness over last couple of months. Pt has pessary that she removes and replaces herself. Pt denies any vaginal bleeding, discharge or odor. Pt states would like to change to less messy moisturizer since using coconut oil per Debbi, CNM and states purchased Revaree. Pt wanting to know if can use this? Would like to have some sort of vaginal suppository.  Pt states has same new partner since Oct 2020 and is currently SA.   Advised OV for further evaluation with Dr Sabra Heck at [facility]. Pt agreeable. Pt scheduled with Dr Sabra Heck for [place] on 05/26/19 at 8am. Pt verbalized understanding of date and time of appt. CPS neg   Routing to Dr Sabra Heck for review.  Encounter closed.

## 2019-05-26 ENCOUNTER — Ambulatory Visit (INDEPENDENT_AMBULATORY_CARE_PROVIDER_SITE_OTHER): Payer: Medicare Other | Admitting: Obstetrics & Gynecology

## 2019-05-26 ENCOUNTER — Encounter: Payer: Self-pay | Admitting: Obstetrics & Gynecology

## 2019-05-26 ENCOUNTER — Other Ambulatory Visit: Payer: Self-pay

## 2019-05-26 VITALS — BP 114/70 | HR 70 | Temp 97.0°F | Resp 16 | Wt 153.0 lb

## 2019-05-26 DIAGNOSIS — N952 Postmenopausal atrophic vaginitis: Secondary | ICD-10-CM

## 2019-05-26 DIAGNOSIS — N819 Female genital prolapse, unspecified: Secondary | ICD-10-CM | POA: Diagnosis not present

## 2019-05-26 DIAGNOSIS — N8111 Cystocele, midline: Secondary | ICD-10-CM | POA: Diagnosis not present

## 2019-05-26 MED ORDER — ESTRADIOL 0.1 MG/GM VA CREA: TOPICAL_CREAM | VAGINAL | 3 refills | Status: DC

## 2019-07-01 DIAGNOSIS — E7849 Other hyperlipidemia: Secondary | ICD-10-CM | POA: Diagnosis not present

## 2019-07-01 DIAGNOSIS — M859 Disorder of bone density and structure, unspecified: Secondary | ICD-10-CM | POA: Diagnosis not present

## 2019-07-01 DIAGNOSIS — E038 Other specified hypothyroidism: Secondary | ICD-10-CM | POA: Diagnosis not present

## 2019-07-14 ENCOUNTER — Other Ambulatory Visit: Payer: Self-pay | Admitting: Internal Medicine

## 2019-07-14 DIAGNOSIS — E785 Hyperlipidemia, unspecified: Secondary | ICD-10-CM

## 2019-09-05 ENCOUNTER — Ambulatory Visit
Admission: RE | Admit: 2019-09-05 | Discharge: 2019-09-05 | Disposition: A | Discharge status: Home / Self Care | Payer: No Typology Code available for payment source | Source: Ambulatory Visit | Attending: Internal Medicine | Admitting: Internal Medicine

## 2019-09-05 DIAGNOSIS — E785 Hyperlipidemia, unspecified: Secondary | ICD-10-CM

## 2019-10-15 DIAGNOSIS — Z23 Encounter for immunization: Secondary | ICD-10-CM | POA: Diagnosis not present

## 2019-10-19 DIAGNOSIS — L570 Actinic keratosis: Secondary | ICD-10-CM | POA: Diagnosis not present

## 2019-10-19 DIAGNOSIS — Z85828 Personal history of other malignant neoplasm of skin: Secondary | ICD-10-CM | POA: Diagnosis not present

## 2019-10-19 DIAGNOSIS — D2271 Melanocytic nevi of right lower limb, including hip: Secondary | ICD-10-CM | POA: Diagnosis not present

## 2019-10-19 DIAGNOSIS — L814 Other melanin hyperpigmentation: Secondary | ICD-10-CM | POA: Diagnosis not present

## 2019-10-19 DIAGNOSIS — D1801 Hemangioma of skin and subcutaneous tissue: Secondary | ICD-10-CM | POA: Diagnosis not present

## 2019-10-19 DIAGNOSIS — D225 Melanocytic nevi of trunk: Secondary | ICD-10-CM | POA: Diagnosis not present

## 2019-10-19 DIAGNOSIS — L821 Other seborrheic keratosis: Secondary | ICD-10-CM | POA: Diagnosis not present

## 2019-10-26 ENCOUNTER — Ambulatory Visit: Payer: Medicare Other | Admitting: Certified Nurse Midwife

## 2019-10-27 NOTE — Progress Notes (Signed)
69 y.o. [id number] Divorced White or Caucasian female here for breast and pelvic exam.  I am also following her for her cystocele.  Reports she is having a little bit of incontinence.  This is typically only at the end of the day.  She takes out the pessary two or three times a week.  Does not have any problems with this.    Denies vaginal bleeding.  Together with significant other for a year and a half.    She had a coronary CT score.  Pt's score was 0 so she is very pleased about this.  She also had a CT due to diverticulosis.    Patient's last menstrual period was 08/14/1991 (approximate).  hysterectomy        Sexually active: Yes.    H/O STD:  no  Health Maintenance: PCP:  Shon Baton  Last wellness appt was 07/2019  Did blood work at that appt: yes   Vaccines are up to date:  Yes except shingrix Colonoscopy:  2014 normal MMG:  10-27-2018 category b density,  birads 1:neg, scheduled for 12/2019 BMD:  2017  Last pap smear:  10-07-17 neg HPV HR neg H/o abnormal pap smear:  Yrs ago   reports that she quit smoking about 47 years ago. Her smoking use included cigarettes. She has a 0.75 pack-year smoking history. She has never used smokeless tobacco. She reports current alcohol use of about 1.0 - 2.0 standard drink of alcohol per week. She reports that she does not use drugs.  Past Medical History:  Diagnosis Date  . Abnormal Pap smear of cervix   . Allergy   . Arthritis   . Diverticulitis   . Enterocele    of vaginal vault  . Hyperlipidemia 1998  . Hypertension   . Hypothyroidism 2008    Past Surgical History:  Procedure Laterality Date  . ABDOMINAL HYSTERECTOMY  1993   ovaries remain, due to fibroids  . COLPOSCOPY    . LAPAROSCOPIC CHOLECYSTECTOMY  2004  . SKIN CANCER EXCISION Left 06/2012   squamous cell left shoulder  . TONSILLECTOMY  1968    Current Outpatient Medications  Medication Sig Dispense Refill  . Cholecalciferol (D3-1000) 25 MCG (1000 UT) capsule     . COLLAGEN  PO Take by mouth. powder    . estradiol (ESTRACE) 0.1 MG/GM vaginal cream 1 gram pv twice weekly 42.5 g 3  . irbesartan (AVAPRO) 150 MG tablet irbesartan 150 mg tablet  TAKE 1 TABLET BY MOUTH EVERY DAY    . levothyroxine (SYNTHROID, LEVOTHROID) 88 MCG tablet Take 88 mcg by mouth daily before breakfast.    . Magnesium 250 MG TABS     . Omega-3 Fatty Acids (FISH OIL PO)     . Probiotic Product (PROBIOTIC PO)     . Turmeric Curcumin 500 MG CAPS     . vitamin C (ASCORBIC ACID) 500 MG tablet Take 1,000 mg by mouth daily.      No current facility-administered medications for this visit.    Family History  Problem Relation Age of Onset  . Hypertension Father   . Dementia Father   . Heart failure Mother   . Hyperlipidemia Mother   . Breast cancer Maternal Aunt   . Heart failure Paternal Uncle   . Breast cancer Maternal Grandmother   . Diabetes Maternal Grandmother   . Multiple births Paternal Grandmother   . Colon cancer Neg Hx     Review of Systems  Constitutional: Negative.  HENT: Negative.   Eyes: Negative.   Respiratory: Negative.   Cardiovascular: Negative.   Gastrointestinal: Negative.   Endocrine: Negative.   Genitourinary: Negative.   Musculoskeletal: Negative.   Skin: Negative.   Allergic/Immunologic: Negative.   Neurological: Negative.   Hematological: Negative.   Psychiatric/Behavioral: Negative.     Exam:   BP 122/80   Pulse 70   Resp 16   Ht 5' 1.75" (1.568 m)   Wt 161 lb (73 kg)   LMP 08/14/1991 (Approximate)   BMI 29.69 kg/m   Height: 5' 1.75" (156.8 cm)  General appearance: alert, cooperative and appears stated age Breasts: normal appearance, no masses or tenderness Abdomen: soft, non-tender; bowel sounds normal; no masses,  no organomegaly Lymph nodes: Cervical, supraclavicular, and axillary nodes normal.  No abnormal inguinal nodes palpated Neurologic: Grossly normal  Pelvic: External genitalia:  no lesions              Urethra:  normal  appearing urethra with no masses, tenderness or lesions              Bartholins and Skenes: normal                 Vagina: normal appearing vagina with normal color and discharge, no lesions, 3rd degree cystocele present with paravaginal weakness present              Cervix: absent              Pap taken: Yes.   Bimanual Exam:  Uterus:  normal size, contour, position, consistency, mobility, non-tender              Adnexa: normal adnexa and no mass, fullness, tenderness               Rectovaginal: Confirms               Anus:  normal sphincter tone, no lesions  Chaperone, Olene Floss, CMA, was present for exam.  A:  Breast and Pelvic exam PMP, no HRT H/o TAH, ovaries Cystocele Pessary use (dish pessary) Hypertension, hypothyroidism managed by Dr. Virgina Jock  P:   Mammogram guidelines reviewed.  Doing yearly. pap smear obtained.  Shared decision making discussed today.  Pt desired having today RF for estrace vaginal cream 1 gram pv twice weekly.  #42.5 gm/3RF Colonoscopy 2014 BMD done with Dr. Virgina Jock Vaccines reviewed return annually or prn  25 minutes of total time was spent for this patient encounter, including preparation, face-to-face counseling with the patient and coordination of care, and documentation of the encounter.

## 2019-10-28 ENCOUNTER — Ambulatory Visit: Payer: Medicare Other

## 2019-10-29 DIAGNOSIS — Z23 Encounter for immunization: Secondary | ICD-10-CM | POA: Diagnosis not present

## 2019-10-31 ENCOUNTER — Encounter: Payer: Self-pay | Admitting: Obstetrics & Gynecology

## 2019-10-31 ENCOUNTER — Other Ambulatory Visit: Payer: Self-pay

## 2019-10-31 ENCOUNTER — Other Ambulatory Visit (HOSPITAL_COMMUNITY)
Admission: RE | Admit: 2019-10-31 | Discharge: 2019-10-31 | Disposition: A | Discharge status: Home / Self Care | Payer: Medicare Other | Source: Ambulatory Visit | Attending: Cardiology | Admitting: Cardiology

## 2019-10-31 ENCOUNTER — Ambulatory Visit (INDEPENDENT_AMBULATORY_CARE_PROVIDER_SITE_OTHER): Payer: Medicare Other | Admitting: Obstetrics & Gynecology

## 2019-10-31 VITALS — BP 122/80 | HR 70 | Resp 16 | Ht 61.75 in | Wt 161.0 lb

## 2019-10-31 DIAGNOSIS — N952 Postmenopausal atrophic vaginitis: Secondary | ICD-10-CM | POA: Diagnosis not present

## 2019-10-31 DIAGNOSIS — Z01411 Encounter for gynecological examination (general) (routine) with abnormal findings: Secondary | ICD-10-CM | POA: Diagnosis not present

## 2019-10-31 DIAGNOSIS — Z124 Encounter for screening for malignant neoplasm of cervix: Secondary | ICD-10-CM

## 2019-10-31 MED ORDER — ESTRADIOL 0.1 MG/GM VA CREA: TOPICAL_CREAM | VAGINAL | 3 refills | Status: DC

## 2019-11-01 LAB — CYTOLOGY - PAP: Diagnosis: NEGATIVE

## 2019-11-08 ENCOUNTER — Other Ambulatory Visit: Payer: Self-pay

## 2019-11-08 MED ORDER — FLUCONAZOLE 150 MG PO TABS: 150.0000 mg | ORAL_TABLET | Freq: Once | ORAL | 0 refills | Status: AC

## 2019-12-26 ENCOUNTER — Ambulatory Visit: Payer: No Typology Code available for payment source

## 2020-01-17 ENCOUNTER — Other Ambulatory Visit: Payer: Self-pay

## 2020-01-17 ENCOUNTER — Ambulatory Visit
Admission: RE | Admit: 2020-01-17 | Discharge: 2020-01-17 | Disposition: A | Discharge status: Home / Self Care | Payer: Medicare Other | Source: Ambulatory Visit | Attending: Internal Medicine | Admitting: Internal Medicine

## 2020-01-17 DIAGNOSIS — Z1231 Encounter for screening mammogram for malignant neoplasm of breast: Secondary | ICD-10-CM | POA: Diagnosis not present

## 2020-01-19 DIAGNOSIS — I10 Essential (primary) hypertension: Secondary | ICD-10-CM | POA: Diagnosis not present

## 2020-01-19 DIAGNOSIS — E663 Overweight: Secondary | ICD-10-CM | POA: Diagnosis not present

## 2020-01-19 DIAGNOSIS — M1990 Unspecified osteoarthritis, unspecified site: Secondary | ICD-10-CM | POA: Diagnosis not present

## 2020-01-19 DIAGNOSIS — Z960 Presence of urogenital implants: Secondary | ICD-10-CM | POA: Diagnosis not present

## 2020-01-19 DIAGNOSIS — E039 Hypothyroidism, unspecified: Secondary | ICD-10-CM | POA: Diagnosis not present

## 2020-01-19 DIAGNOSIS — M8580 Other specified disorders of bone density and structure, unspecified site: Secondary | ICD-10-CM | POA: Diagnosis not present

## 2020-01-19 DIAGNOSIS — K589 Irritable bowel syndrome without diarrhea: Secondary | ICD-10-CM | POA: Diagnosis not present

## 2020-01-19 DIAGNOSIS — E785 Hyperlipidemia, unspecified: Secondary | ICD-10-CM | POA: Diagnosis not present

## 2020-01-19 DIAGNOSIS — N3941 Urge incontinence: Secondary | ICD-10-CM | POA: Diagnosis not present

## 2020-01-19 DIAGNOSIS — Z23 Encounter for immunization: Secondary | ICD-10-CM | POA: Diagnosis not present

## 2020-01-19 DIAGNOSIS — Z1331 Encounter for screening for depression: Secondary | ICD-10-CM | POA: Diagnosis not present

## 2020-01-19 DIAGNOSIS — F419 Anxiety disorder, unspecified: Secondary | ICD-10-CM | POA: Diagnosis not present

## 2020-01-21 DIAGNOSIS — Z1152 Encounter for screening for COVID-19: Secondary | ICD-10-CM | POA: Diagnosis not present

## 2020-02-16 ENCOUNTER — Telehealth: Payer: Self-pay | Admitting: *Deleted

## 2020-02-16 NOTE — Telephone Encounter (Signed)
Pt left VM message stating that she is a pt of Dr. Sabra Heck and  thinks she has UTI. She is requesting Rx for antibiotic. Pt also would like to have her records transferred from Dr. Ammie Ferrier previous office.   2/3  4259  Call returned to pt and discussed her concern. She stated that she had been having some soreness above her pubic bone for 4-5 days however it is gone now. She no longer feels that she requires prescription and states she feels fine. She denied having burning with urination. Pt was offered nurse visit appt today for urinalysis and declined. She was advised to contact our office if sx reoccur or to call Dr. Ammie Ferrier new office after 02/27/20. Pt was also advised that all of her records are available for Dr. Sabra Heck within the [facility] system. She voiced understanding of all information provided.

## 2020-05-05 DIAGNOSIS — Z23 Encounter for immunization: Secondary | ICD-10-CM | POA: Diagnosis not present

## 2020-07-17 DIAGNOSIS — E785 Hyperlipidemia, unspecified: Secondary | ICD-10-CM | POA: Diagnosis not present

## 2020-07-17 DIAGNOSIS — E039 Hypothyroidism, unspecified: Secondary | ICD-10-CM | POA: Diagnosis not present

## 2020-07-17 DIAGNOSIS — M8589 Other specified disorders of bone density and structure, multiple sites: Secondary | ICD-10-CM | POA: Diagnosis not present

## 2020-07-17 DIAGNOSIS — M859 Disorder of bone density and structure, unspecified: Secondary | ICD-10-CM | POA: Diagnosis not present

## 2020-07-24 DIAGNOSIS — F419 Anxiety disorder, unspecified: Secondary | ICD-10-CM | POA: Diagnosis not present

## 2020-07-24 DIAGNOSIS — K5790 Diverticulosis of intestine, part unspecified, without perforation or abscess without bleeding: Secondary | ICD-10-CM | POA: Diagnosis not present

## 2020-07-24 DIAGNOSIS — Z1389 Encounter for screening for other disorder: Secondary | ICD-10-CM | POA: Diagnosis not present

## 2020-07-24 DIAGNOSIS — Z0000 Encounter for general adult medical examination without abnormal findings: Secondary | ICD-10-CM | POA: Diagnosis not present

## 2020-07-24 DIAGNOSIS — E785 Hyperlipidemia, unspecified: Secondary | ICD-10-CM | POA: Diagnosis not present

## 2020-07-24 DIAGNOSIS — Z1212 Encounter for screening for malignant neoplasm of rectum: Secondary | ICD-10-CM | POA: Diagnosis not present

## 2020-07-24 DIAGNOSIS — E663 Overweight: Secondary | ICD-10-CM | POA: Diagnosis not present

## 2020-07-24 DIAGNOSIS — Z1331 Encounter for screening for depression: Secondary | ICD-10-CM | POA: Diagnosis not present

## 2020-07-24 DIAGNOSIS — N3941 Urge incontinence: Secondary | ICD-10-CM | POA: Diagnosis not present

## 2020-07-24 DIAGNOSIS — E039 Hypothyroidism, unspecified: Secondary | ICD-10-CM | POA: Diagnosis not present

## 2020-07-24 DIAGNOSIS — I10 Essential (primary) hypertension: Secondary | ICD-10-CM | POA: Diagnosis not present

## 2020-07-24 DIAGNOSIS — M8580 Other specified disorders of bone density and structure, unspecified site: Secondary | ICD-10-CM | POA: Diagnosis not present

## 2020-07-24 DIAGNOSIS — Z960 Presence of urogenital implants: Secondary | ICD-10-CM | POA: Diagnosis not present

## 2020-07-24 DIAGNOSIS — R82998 Other abnormal findings in urine: Secondary | ICD-10-CM | POA: Diagnosis not present

## 2020-08-31 ENCOUNTER — Telehealth: Payer: Self-pay | Admitting: Obstetrics and Gynecology

## 2020-08-31 DIAGNOSIS — N3000 Acute cystitis without hematuria: Secondary | ICD-10-CM

## 2020-08-31 MED ORDER — SULFAMETHOXAZOLE-TRIMETHOPRIM 800-160 MG PO TABS: 1.0000 | ORAL_TABLET | Freq: Two times a day (BID) | ORAL | 0 refills | Status: AC

## 2020-08-31 NOTE — Telephone Encounter (Signed)
Patient called after hours line stating she has pain in her bladder area over her pubic bone. Denies burning with urination. This feels similar to the last time she had an infection. Will send bactrim BID x3 days to the pharmacy. Advised her that if symptoms do not resolve to let us know so she can get a urine culture.   Jaquita Folds, MD

## 2020-10-06 DIAGNOSIS — Z23 Encounter for immunization: Secondary | ICD-10-CM | POA: Diagnosis not present

## 2020-10-16 DIAGNOSIS — L570 Actinic keratosis: Secondary | ICD-10-CM | POA: Diagnosis not present

## 2020-10-16 DIAGNOSIS — Z85828 Personal history of other malignant neoplasm of skin: Secondary | ICD-10-CM | POA: Diagnosis not present

## 2020-10-16 DIAGNOSIS — D225 Melanocytic nevi of trunk: Secondary | ICD-10-CM | POA: Diagnosis not present

## 2020-10-16 DIAGNOSIS — L821 Other seborrheic keratosis: Secondary | ICD-10-CM | POA: Diagnosis not present

## 2020-10-16 DIAGNOSIS — D1801 Hemangioma of skin and subcutaneous tissue: Secondary | ICD-10-CM | POA: Diagnosis not present

## 2020-10-16 DIAGNOSIS — D2271 Melanocytic nevi of right lower limb, including hip: Secondary | ICD-10-CM | POA: Diagnosis not present

## 2020-10-16 DIAGNOSIS — L814 Other melanin hyperpigmentation: Secondary | ICD-10-CM | POA: Diagnosis not present

## 2020-10-16 DIAGNOSIS — D2272 Melanocytic nevi of left lower limb, including hip: Secondary | ICD-10-CM | POA: Diagnosis not present

## 2020-10-16 DIAGNOSIS — L578 Other skin changes due to chronic exposure to nonionizing radiation: Secondary | ICD-10-CM | POA: Diagnosis not present

## 2020-10-20 DIAGNOSIS — Z23 Encounter for immunization: Secondary | ICD-10-CM | POA: Diagnosis not present

## 2020-11-01 ENCOUNTER — Other Ambulatory Visit: Payer: Self-pay | Admitting: Internal Medicine

## 2020-11-01 DIAGNOSIS — Z1231 Encounter for screening mammogram for malignant neoplasm of breast: Secondary | ICD-10-CM

## 2021-01-18 ENCOUNTER — Ambulatory Visit
Admission: RE | Admit: 2021-01-18 | Discharge: 2021-01-18 | Disposition: A | Discharge status: Home / Self Care | Payer: Medicare Other | Source: Ambulatory Visit | Attending: Internal Medicine | Admitting: Internal Medicine

## 2021-01-18 DIAGNOSIS — Z1231 Encounter for screening mammogram for malignant neoplasm of breast: Secondary | ICD-10-CM

## 2021-02-07 DIAGNOSIS — I10 Essential (primary) hypertension: Secondary | ICD-10-CM | POA: Diagnosis not present

## 2021-02-07 DIAGNOSIS — K589 Irritable bowel syndrome without diarrhea: Secondary | ICD-10-CM | POA: Diagnosis not present

## 2021-02-07 DIAGNOSIS — Z960 Presence of urogenital implants: Secondary | ICD-10-CM | POA: Diagnosis not present

## 2021-02-07 DIAGNOSIS — E785 Hyperlipidemia, unspecified: Secondary | ICD-10-CM | POA: Diagnosis not present

## 2021-02-07 DIAGNOSIS — M1990 Unspecified osteoarthritis, unspecified site: Secondary | ICD-10-CM | POA: Diagnosis not present

## 2021-02-07 DIAGNOSIS — E039 Hypothyroidism, unspecified: Secondary | ICD-10-CM | POA: Diagnosis not present

## 2021-02-07 DIAGNOSIS — R809 Proteinuria, unspecified: Secondary | ICD-10-CM | POA: Diagnosis not present

## 2021-02-07 DIAGNOSIS — E663 Overweight: Secondary | ICD-10-CM | POA: Diagnosis not present

## 2021-02-07 DIAGNOSIS — K5790 Diverticulosis of intestine, part unspecified, without perforation or abscess without bleeding: Secondary | ICD-10-CM | POA: Diagnosis not present

## 2021-02-07 DIAGNOSIS — N3941 Urge incontinence: Secondary | ICD-10-CM | POA: Diagnosis not present

## 2021-02-07 DIAGNOSIS — F419 Anxiety disorder, unspecified: Secondary | ICD-10-CM | POA: Diagnosis not present

## 2021-02-07 DIAGNOSIS — M8580 Other specified disorders of bone density and structure, unspecified site: Secondary | ICD-10-CM | POA: Diagnosis not present

## 2021-03-07 ENCOUNTER — Encounter (HOSPITAL_BASED_OUTPATIENT_CLINIC_OR_DEPARTMENT_OTHER): Payer: Self-pay

## 2021-03-07 ENCOUNTER — Other Ambulatory Visit (HOSPITAL_BASED_OUTPATIENT_CLINIC_OR_DEPARTMENT_OTHER): Payer: Self-pay

## 2021-03-07 ENCOUNTER — Other Ambulatory Visit: Payer: Self-pay

## 2021-03-07 ENCOUNTER — Emergency Department (HOSPITAL_BASED_OUTPATIENT_CLINIC_OR_DEPARTMENT_OTHER)
Admission: EM | Admit: 2021-03-07 | Discharge: 2021-03-07 | Disposition: A | Discharge status: Home / Self Care | Payer: Medicare Other | Attending: Emergency Medicine | Admitting: Emergency Medicine

## 2021-03-07 DIAGNOSIS — H6690 Otitis media, unspecified, unspecified ear: Secondary | ICD-10-CM

## 2021-03-07 DIAGNOSIS — H9201 Otalgia, right ear: Secondary | ICD-10-CM | POA: Diagnosis present

## 2021-03-07 DIAGNOSIS — H6691 Otitis media, unspecified, right ear: Secondary | ICD-10-CM | POA: Diagnosis not present

## 2021-03-07 MED ORDER — AMOXICILLIN-POT CLAVULANATE 875-125 MG PO TABS
1.0000 | ORAL_TABLET | Freq: Two times a day (BID) | ORAL | 0 refills | Status: DC
  Filled 2021-03-07: qty 14, 7d supply, fill #0

## 2021-03-07 NOTE — ED Triage Notes (Signed)
Pt came in POV with c/o of bilateral ear pain, with greater intensity in the Right ear. Pt stated her pain started Tuesday, which is also when she started developing a cold - headache, cough, congestion. Pt has a neg at home Covid test performed on Wednesday. Pt denies fever/N/V/D and taking tylenol and zyrtec for symptom management.

## 2021-03-07 NOTE — ED Notes (Signed)
Mitzi Hansen - RN aware of pt's BP

## 2021-03-07 NOTE — ED Provider Notes (Signed)
[facility] EMERGENCY DEPT Provider Note   CSN: [id number] Arrival date & time: 03/07/21  9924     History  Chief Complaint  Patient presents with   Otalgia    Erin Day is a 71 y.o. female.  71 year old female presents with right ear pain x2 days.  No fever or chills.  Using histamines without relief.  No drainage.  Mild sore throat.  No vomiting or diarrhea.  Negative home COVID test yesterday      Home Medications Prior to Admission medications   Medication Sig Start Date End Date Taking? Authorizing Provider  amoxicillin-clavulanate (AUGMENTIN) 875-125 MG tablet Take 1 tablet by mouth every 12 (twelve) hours. 03/07/21  Yes Lacretia Leigh, MD  Cholecalciferol (D3-1000) 25 MCG (1000 UT) capsule     [provider]  COLLAGEN PO Take by mouth. powder    [provider]  estradiol (ESTRACE) 0.1 MG/GM vaginal cream 1 gram pv twice weekly 10/31/19   Megan Salon, MD  irbesartan (AVAPRO) 150 MG tablet irbesartan 150 mg tablet  TAKE 1 TABLET BY MOUTH EVERY DAY    [provider]  levothyroxine (SYNTHROID, LEVOTHROID) 88 MCG tablet Take 88 mcg by mouth daily before breakfast.    [provider]  Magnesium 250 MG TABS     [provider]  Omega-3 Fatty Acids (FISH OIL PO)     [provider]  Probiotic Product (PROBIOTIC PO)     [provider]  Turmeric Curcumin 500 MG CAPS     [provider]  vitamin C (ASCORBIC ACID) 500 MG tablet Take 1,000 mg by mouth daily.     [provider]      Allergies    Codeine and Morphine and related    Review of Systems   Review of Systems  All other systems reviewed and are negative.  Physical Exam Updated Vital Signs BP (!) 186/98 (BP Location: Left Arm)    Pulse 70    Temp 98.1 F (36.7 C) (Oral)    Resp 16    Ht 1.575 m (5\' 2" )    Wt 70.3 kg    LMP 08/14/1991 (Approximate)    SpO2 99%    BMI 28.35 kg/m  Physical Exam Vitals and  nursing note reviewed.  Constitutional:      General: She is not in acute distress.    Appearance: Normal appearance. She is well-developed. She is not toxic-appearing.  HENT:     Head: Normocephalic and atraumatic.     Right Ear: Tympanic membrane is erythematous.  Eyes:     General: Lids are normal.     Conjunctiva/sclera: Conjunctivae normal.     Pupils: Pupils are equal, round, and reactive to light.  Neck:     Thyroid: No thyroid mass.     Trachea: No tracheal deviation.  Cardiovascular:     Rate and Rhythm: Normal rate and regular rhythm.     Heart sounds: Normal heart sounds. No murmur heard.   No gallop.  Pulmonary:     Effort: Pulmonary effort is normal. No respiratory distress.     Breath sounds: Normal breath sounds. No stridor. No decreased breath sounds, wheezing, rhonchi or rales.  Abdominal:     General: There is no distension.     Palpations: Abdomen is soft.     Tenderness: There is no abdominal tenderness. There is no rebound.  Musculoskeletal:        General: No tenderness. Normal range of motion.  Cervical back: Normal range of motion and neck supple.  Skin:    General: Skin is warm and dry.     Findings: No abrasion or rash.  Neurological:     Mental Status: She is alert and oriented to person, place, and time. Mental status is at baseline.     GCS: GCS eye subscore is 4. GCS verbal subscore is 5. GCS motor subscore is 6.     Cranial Nerves: No cranial nerve deficit.     Sensory: No sensory deficit.     Motor: Motor function is intact.  Psychiatric:        Attention and Perception: Attention normal.        Speech: Speech normal.        Behavior: Behavior normal.    ED Results / Procedures / Treatments   Labs (all labs ordered are listed, but only abnormal results are displayed) Labs Reviewed - No data to display  EKG None  Radiology No results found.  Procedures Procedures    Medications Ordered in ED Medications - No data to  display  ED Course/ Medical Decision Making/ A&P                           Medical Decision Making Risk Prescription drug management.   Patient with right-sided otitis media.  Consider other serious etiology such as mastoiditis although she is not tender along her mastoid sinus.  She is afebrile at this time.  Will place on antibiotics and discharge        Final Clinical Impression(s) / ED Diagnoses Final diagnoses:  Acute otitis media, unspecified otitis media type    Rx / DC Orders ED Discharge Orders          Ordered    amoxicillin-clavulanate (AUGMENTIN) 875-125 MG tablet  Every 12 hours        03/07/21 0950              Lacretia Leigh, MD 03/07/21 [phone]

## 2021-06-21 DIAGNOSIS — H6993 Unspecified Eustachian tube disorder, bilateral: Secondary | ICD-10-CM | POA: Diagnosis not present

## 2021-06-21 DIAGNOSIS — J309 Allergic rhinitis, unspecified: Secondary | ICD-10-CM | POA: Diagnosis not present

## 2021-06-21 DIAGNOSIS — I10 Essential (primary) hypertension: Secondary | ICD-10-CM | POA: Diagnosis not present

## 2021-06-21 DIAGNOSIS — R059 Cough, unspecified: Secondary | ICD-10-CM | POA: Diagnosis not present

## 2021-07-24 ENCOUNTER — Other Ambulatory Visit (HOSPITAL_BASED_OUTPATIENT_CLINIC_OR_DEPARTMENT_OTHER): Payer: Self-pay | Admitting: *Deleted

## 2021-07-24 DIAGNOSIS — N952 Postmenopausal atrophic vaginitis: Secondary | ICD-10-CM

## 2021-07-24 MED ORDER — ESTRADIOL 0.1 MG/GM VA CREA: TOPICAL_CREAM | VAGINAL | 0 refills | Status: DC

## 2021-08-09 DIAGNOSIS — I10 Essential (primary) hypertension: Secondary | ICD-10-CM | POA: Diagnosis not present

## 2021-08-09 DIAGNOSIS — K5790 Diverticulosis of intestine, part unspecified, without perforation or abscess without bleeding: Secondary | ICD-10-CM | POA: Diagnosis not present

## 2021-08-09 DIAGNOSIS — E785 Hyperlipidemia, unspecified: Secondary | ICD-10-CM | POA: Diagnosis not present

## 2021-08-09 DIAGNOSIS — R7989 Other specified abnormal findings of blood chemistry: Secondary | ICD-10-CM | POA: Diagnosis not present

## 2021-08-09 DIAGNOSIS — M8580 Other specified disorders of bone density and structure, unspecified site: Secondary | ICD-10-CM | POA: Diagnosis not present

## 2021-08-09 DIAGNOSIS — E039 Hypothyroidism, unspecified: Secondary | ICD-10-CM | POA: Diagnosis not present

## 2021-08-09 DIAGNOSIS — F419 Anxiety disorder, unspecified: Secondary | ICD-10-CM | POA: Diagnosis not present

## 2021-08-16 DIAGNOSIS — M1990 Unspecified osteoarthritis, unspecified site: Secondary | ICD-10-CM | POA: Diagnosis not present

## 2021-08-16 DIAGNOSIS — E039 Hypothyroidism, unspecified: Secondary | ICD-10-CM | POA: Diagnosis not present

## 2021-08-16 DIAGNOSIS — E785 Hyperlipidemia, unspecified: Secondary | ICD-10-CM | POA: Diagnosis not present

## 2021-08-16 DIAGNOSIS — I10 Essential (primary) hypertension: Secondary | ICD-10-CM | POA: Diagnosis not present

## 2021-08-16 DIAGNOSIS — N3941 Urge incontinence: Secondary | ICD-10-CM | POA: Diagnosis not present

## 2021-08-16 DIAGNOSIS — E663 Overweight: Secondary | ICD-10-CM | POA: Diagnosis not present

## 2021-08-16 DIAGNOSIS — R809 Proteinuria, unspecified: Secondary | ICD-10-CM | POA: Diagnosis not present

## 2021-08-16 DIAGNOSIS — Z960 Presence of urogenital implants: Secondary | ICD-10-CM | POA: Diagnosis not present

## 2021-08-16 DIAGNOSIS — R82998 Other abnormal findings in urine: Secondary | ICD-10-CM | POA: Diagnosis not present

## 2021-08-16 DIAGNOSIS — I7300 Raynaud's syndrome without gangrene: Secondary | ICD-10-CM | POA: Diagnosis not present

## 2021-08-16 DIAGNOSIS — Z1331 Encounter for screening for depression: Secondary | ICD-10-CM | POA: Diagnosis not present

## 2021-08-16 DIAGNOSIS — Z0000 Encounter for general adult medical examination without abnormal findings: Secondary | ICD-10-CM | POA: Diagnosis not present

## 2021-08-16 DIAGNOSIS — F419 Anxiety disorder, unspecified: Secondary | ICD-10-CM | POA: Diagnosis not present

## 2021-08-26 DIAGNOSIS — Z0000 Encounter for general adult medical examination without abnormal findings: Secondary | ICD-10-CM | POA: Diagnosis not present

## 2021-10-22 DIAGNOSIS — L578 Other skin changes due to chronic exposure to nonionizing radiation: Secondary | ICD-10-CM | POA: Diagnosis not present

## 2021-10-22 DIAGNOSIS — L570 Actinic keratosis: Secondary | ICD-10-CM | POA: Diagnosis not present

## 2021-10-22 DIAGNOSIS — D2272 Melanocytic nevi of left lower limb, including hip: Secondary | ICD-10-CM | POA: Diagnosis not present

## 2021-10-22 DIAGNOSIS — Z85828 Personal history of other malignant neoplasm of skin: Secondary | ICD-10-CM | POA: Diagnosis not present

## 2021-10-22 DIAGNOSIS — D225 Melanocytic nevi of trunk: Secondary | ICD-10-CM | POA: Diagnosis not present

## 2021-10-22 DIAGNOSIS — L814 Other melanin hyperpigmentation: Secondary | ICD-10-CM | POA: Diagnosis not present

## 2021-10-22 DIAGNOSIS — B070 Plantar wart: Secondary | ICD-10-CM | POA: Diagnosis not present

## 2021-10-22 DIAGNOSIS — D2271 Melanocytic nevi of right lower limb, including hip: Secondary | ICD-10-CM | POA: Diagnosis not present

## 2021-10-22 DIAGNOSIS — L821 Other seborrheic keratosis: Secondary | ICD-10-CM | POA: Diagnosis not present

## 2021-10-26 DIAGNOSIS — Z23 Encounter for immunization: Secondary | ICD-10-CM | POA: Diagnosis not present

## 2021-11-01 ENCOUNTER — Ambulatory Visit (INDEPENDENT_AMBULATORY_CARE_PROVIDER_SITE_OTHER): Payer: Medicare Other | Admitting: Obstetrics & Gynecology

## 2021-11-01 ENCOUNTER — Encounter (HOSPITAL_BASED_OUTPATIENT_CLINIC_OR_DEPARTMENT_OTHER): Payer: Self-pay | Admitting: Obstetrics & Gynecology

## 2021-11-01 VITALS — BP 172/86 | HR 61 | Ht 62.0 in | Wt 162.8 lb

## 2021-11-01 DIAGNOSIS — N8111 Cystocele, midline: Secondary | ICD-10-CM | POA: Diagnosis not present

## 2021-11-01 DIAGNOSIS — N952 Postmenopausal atrophic vaginitis: Secondary | ICD-10-CM

## 2021-11-01 DIAGNOSIS — Z4689 Encounter for fitting and adjustment of other specified devices: Secondary | ICD-10-CM

## 2021-11-01 DIAGNOSIS — Z780 Asymptomatic menopausal state: Secondary | ICD-10-CM | POA: Diagnosis not present

## 2021-11-01 DIAGNOSIS — Z01411 Encounter for gynecological examination (general) (routine) with abnormal findings: Secondary | ICD-10-CM | POA: Diagnosis not present

## 2021-11-01 MED ORDER — ESTRADIOL 0.1 MG/GM VA CREA: TOPICAL_CREAM | VAGINAL | 2 refills | Status: DC

## 2021-11-01 NOTE — Progress Notes (Signed)
71 y.o. [id number] Divorced White or Caucasian female here for breast and pelvic exam.  I am also following her for pessary use.  She does remove and clean it on her own.  When she uses it, she does feel like she empties completely.    She did have a couple of UTIs since I saw her last but these resolved with antibiotics.  Does use vaginal estrogen cream.  No issues.  Needs RF.  Denies vaginal bleeding.  Patient's last menstrual period was 08/14/1991 (approximate).          Sexually active: Yes.    H/O STD:  no  Health Maintenance: PCP:  Dr. Virgina Jock.  Last wellness appt was this fall.  Did blood work at that appt:  yes Vaccines are up to date:  reviewed.  Tdap is due.  Discussed with pt. Colonoscopy:  06/18/2012 MMG:  01/18/2021 Negative BMD:  05/25/2015.  Has done this more recently with Dr. Virgina Jock Last pap smear:  10/31/2019 Negative.   H/o abnormal pap smear:  remote hx   reports that she quit smoking about 49 years ago. Her smoking use included cigarettes. She has a 0.75 pack-year smoking history. She has never used smokeless tobacco. She reports current alcohol use of about 1.0 - 2.0 standard drink of alcohol per week. She reports that she does not use drugs.  Past Medical History:  Diagnosis Date   Abnormal Pap smear of cervix    Allergy    Arthritis    Diverticulitis    Enterocele    of vaginal vault   Hyperlipidemia 1998   Hypertension    Hypothyroidism 2008    Past Surgical History:  Procedure Laterality Date   ABDOMINAL HYSTERECTOMY  1993   ovaries remain, due to fibroids   COLPOSCOPY     LAPAROSCOPIC CHOLECYSTECTOMY  2004   SKIN CANCER EXCISION Left 06/2012   squamous cell left shoulder   TONSILLECTOMY  1968    Current Outpatient Medications  Medication Sig Dispense Refill   amoxicillin-clavulanate (AUGMENTIN) 875-125 MG tablet Take 1 tablet by mouth every 12 (twelve) hours. 14 tablet 0   Cholecalciferol (D3-1000) 25 MCG (1000 UT) capsule      COLLAGEN PO Take by  mouth. powder     irbesartan (AVAPRO) 150 MG tablet irbesartan 150 mg tablet  TAKE 1 TABLET BY MOUTH EVERY DAY     levothyroxine (SYNTHROID, LEVOTHROID) 88 MCG tablet Take 88 mcg by mouth daily before breakfast.     Magnesium 250 MG TABS      Omega-3 Fatty Acids (FISH OIL PO)      Probiotic Product (PROBIOTIC PO)      Turmeric Curcumin 500 MG CAPS      vitamin C (ASCORBIC ACID) 500 MG tablet Take 1,000 mg by mouth daily.      estradiol (ESTRACE) 0.1 MG/GM vaginal cream 1 gram pv twice weekly 42.5 g 2   No current facility-administered medications for this visit.    Family History  Problem Relation Age of Onset   Hypertension Father    Dementia Father    Heart failure Mother    Hyperlipidemia Mother    Breast cancer Maternal Aunt    Heart failure Paternal Uncle    Breast cancer Maternal Grandmother    Diabetes Maternal Grandmother    Multiple births Paternal Grandmother    Colon cancer Neg Hx     Review of Systems  Constitutional: Negative.   Genitourinary: Negative.     Exam:  BP (!) 172/86 (BP Location: Right Arm, Patient Position: Sitting, Cuff Size: Large)   Pulse 61   Ht '5\' 2"'$  (1.575 m) Comment: Reported  Wt 162 lb 12.8 oz (73.8 kg)   LMP 08/14/1991 (Approximate)   BMI 29.78 kg/m   Height: '5\' 2"'$  (157.5 cm) (Reported)  General appearance: alert, cooperative and appears stated age Breasts: normal appearance, no masses or tenderness Abdomen: soft, non-tender; bowel sounds normal; no masses,  no organomegaly Lymph nodes: Cervical, supraclavicular, and axillary nodes normal.  No abnormal inguinal nodes palpated Neurologic: Grossly normal  Pelvic: External genitalia:  no lesions              Urethra:  normal appearing urethra with no masses, tenderness or lesions              Bartholins and Skenes: normal                 Vagina: normal appearing vagina with atrophic changes and no discharge, no lesions, enterocele/cystocele present              Cervix: absent               Pap taken: No. Bimanual Exam:  Uterus:  uterus absent              Adnexa: no mass, fullness, tenderness               Rectovaginal: Confirms               Anus:  normal sphincter tone, no lesions  Dish pessary replaced after exam completed  Chaperone, Ezekiel Ina, RN, was present for exam.  Assessment/Plan: 1. Encntr for gyn exam (general) (routine) w abnormal findings - Pap smear not indicated - Mammogram 01/18/2021 - Colonoscopy 06/18/2012 - Bone mineral density 05/25/15 in Epic.  Pt is sure she's had more recently with Dr. Virgina Jock - lab work done done with Dr. Virgina Jock - vaccines reviewed/updated  2. Postmenopausal  3. Pessary maintenance  4. Cystocele  5. Atrophic vaginitis - RF for estradiol sent to pharmacy - estradiol (ESTRACE) 0.1 MG/GM vaginal cream; 1 gram pv twice weekly  Dispense: 42.5 g; Refill: 2

## 2021-11-04 ENCOUNTER — Encounter: Payer: Self-pay | Admitting: *Deleted

## 2021-11-09 DIAGNOSIS — Z23 Encounter for immunization: Secondary | ICD-10-CM | POA: Diagnosis not present

## 2021-12-25 ENCOUNTER — Other Ambulatory Visit: Payer: Self-pay | Admitting: Internal Medicine

## 2021-12-25 DIAGNOSIS — Z1231 Encounter for screening mammogram for malignant neoplasm of breast: Secondary | ICD-10-CM

## 2022-02-20 ENCOUNTER — Ambulatory Visit
Admission: RE | Admit: 2022-02-20 | Discharge: 2022-02-20 | Disposition: A | Discharge status: Home / Self Care | Payer: Medicare Other | Source: Ambulatory Visit | Attending: Internal Medicine | Admitting: Internal Medicine

## 2022-02-20 DIAGNOSIS — Z1231 Encounter for screening mammogram for malignant neoplasm of breast: Secondary | ICD-10-CM | POA: Diagnosis not present

## 2022-02-24 DIAGNOSIS — I10 Essential (primary) hypertension: Secondary | ICD-10-CM | POA: Diagnosis not present

## 2022-02-24 DIAGNOSIS — E663 Overweight: Secondary | ICD-10-CM | POA: Diagnosis not present

## 2022-02-24 DIAGNOSIS — E039 Hypothyroidism, unspecified: Secondary | ICD-10-CM | POA: Diagnosis not present

## 2022-02-24 DIAGNOSIS — R809 Proteinuria, unspecified: Secondary | ICD-10-CM | POA: Diagnosis not present

## 2022-02-24 DIAGNOSIS — Z1339 Encounter for screening examination for other mental health and behavioral disorders: Secondary | ICD-10-CM | POA: Diagnosis not present

## 2022-02-24 DIAGNOSIS — N3941 Urge incontinence: Secondary | ICD-10-CM | POA: Diagnosis not present

## 2022-02-24 DIAGNOSIS — I7300 Raynaud's syndrome without gangrene: Secondary | ICD-10-CM | POA: Diagnosis not present

## 2022-02-24 DIAGNOSIS — M8580 Other specified disorders of bone density and structure, unspecified site: Secondary | ICD-10-CM | POA: Diagnosis not present

## 2022-02-24 DIAGNOSIS — E785 Hyperlipidemia, unspecified: Secondary | ICD-10-CM | POA: Diagnosis not present

## 2022-02-24 DIAGNOSIS — Z960 Presence of urogenital implants: Secondary | ICD-10-CM | POA: Diagnosis not present

## 2022-02-24 DIAGNOSIS — Z1331 Encounter for screening for depression: Secondary | ICD-10-CM | POA: Diagnosis not present

## 2022-02-24 DIAGNOSIS — F419 Anxiety disorder, unspecified: Secondary | ICD-10-CM | POA: Diagnosis not present

## 2022-07-29 ENCOUNTER — Ambulatory Visit (HOSPITAL_BASED_OUTPATIENT_CLINIC_OR_DEPARTMENT_OTHER): Payer: Medicare Other | Admitting: Obstetrics & Gynecology

## 2022-09-09 DIAGNOSIS — Z1212 Encounter for screening for malignant neoplasm of rectum: Secondary | ICD-10-CM | POA: Diagnosis not present

## 2022-09-09 DIAGNOSIS — E785 Hyperlipidemia, unspecified: Secondary | ICD-10-CM | POA: Diagnosis not present

## 2022-09-09 DIAGNOSIS — I10 Essential (primary) hypertension: Secondary | ICD-10-CM | POA: Diagnosis not present

## 2022-09-09 DIAGNOSIS — R7989 Other specified abnormal findings of blood chemistry: Secondary | ICD-10-CM | POA: Diagnosis not present

## 2022-09-09 DIAGNOSIS — E039 Hypothyroidism, unspecified: Secondary | ICD-10-CM | POA: Diagnosis not present

## 2022-09-19 DIAGNOSIS — M1990 Unspecified osteoarthritis, unspecified site: Secondary | ICD-10-CM | POA: Diagnosis not present

## 2022-09-19 DIAGNOSIS — I10 Essential (primary) hypertension: Secondary | ICD-10-CM | POA: Diagnosis not present

## 2022-09-19 DIAGNOSIS — E039 Hypothyroidism, unspecified: Secondary | ICD-10-CM | POA: Diagnosis not present

## 2022-09-19 DIAGNOSIS — Z1339 Encounter for screening examination for other mental health and behavioral disorders: Secondary | ICD-10-CM | POA: Diagnosis not present

## 2022-09-19 DIAGNOSIS — N3941 Urge incontinence: Secondary | ICD-10-CM | POA: Diagnosis not present

## 2022-09-19 DIAGNOSIS — R82998 Other abnormal findings in urine: Secondary | ICD-10-CM | POA: Diagnosis not present

## 2022-09-19 DIAGNOSIS — E785 Hyperlipidemia, unspecified: Secondary | ICD-10-CM | POA: Diagnosis not present

## 2022-09-19 DIAGNOSIS — Z23 Encounter for immunization: Secondary | ICD-10-CM | POA: Diagnosis not present

## 2022-09-19 DIAGNOSIS — Z0000 Encounter for general adult medical examination without abnormal findings: Secondary | ICD-10-CM | POA: Diagnosis not present

## 2022-09-19 DIAGNOSIS — K589 Irritable bowel syndrome without diarrhea: Secondary | ICD-10-CM | POA: Diagnosis not present

## 2022-09-19 DIAGNOSIS — Z1331 Encounter for screening for depression: Secondary | ICD-10-CM | POA: Diagnosis not present

## 2022-09-19 DIAGNOSIS — F419 Anxiety disorder, unspecified: Secondary | ICD-10-CM | POA: Diagnosis not present

## 2022-09-19 DIAGNOSIS — M8580 Other specified disorders of bone density and structure, unspecified site: Secondary | ICD-10-CM | POA: Diagnosis not present

## 2022-10-10 ENCOUNTER — Ambulatory Visit (HOSPITAL_BASED_OUTPATIENT_CLINIC_OR_DEPARTMENT_OTHER): Payer: Medicare Other | Admitting: Obstetrics & Gynecology

## 2022-10-25 DIAGNOSIS — Z23 Encounter for immunization: Secondary | ICD-10-CM | POA: Diagnosis not present

## 2022-11-04 ENCOUNTER — Ambulatory Visit (HOSPITAL_BASED_OUTPATIENT_CLINIC_OR_DEPARTMENT_OTHER): Payer: Medicare Other | Admitting: Obstetrics & Gynecology

## 2022-11-04 ENCOUNTER — Encounter (HOSPITAL_BASED_OUTPATIENT_CLINIC_OR_DEPARTMENT_OTHER): Payer: Self-pay | Admitting: Obstetrics & Gynecology

## 2022-11-04 VITALS — BP 183/77 | HR 61 | Ht 61.5 in | Wt 161.2 lb

## 2022-11-04 DIAGNOSIS — N8111 Cystocele, midline: Secondary | ICD-10-CM

## 2022-11-04 DIAGNOSIS — N393 Stress incontinence (female) (male): Secondary | ICD-10-CM | POA: Diagnosis not present

## 2022-11-04 NOTE — Patient Instructions (Signed)
Revaree -- hyaluronic acid  Vit E vaginal suppositories  Replens

## 2022-11-04 NOTE — Progress Notes (Signed)
72 y.o. Divorced White female [id number] here for follow up with bladder prolapse and a lot of symptoms related to this.  She reports seeing some blood/small clot when removing her pessary.  She's continued to use the pessary but did desire a recheck.  She does have some urinary incontinence when the pessary is out.  Asks about physical therapy.  Would like referral.    Normally she takes her pessary out every couple weeks and leaves it out.  She reports no recent bleeding.  She is not sexually active.    Patient has been using following pessary style:  incontinence dish.  Allergies  Allergen Reactions   Codeine Nausea And Vomiting   Morphine And Codeine     Bad dreams    ROS: Denies:  vaginal discharge, no recent vaginal bleeding  Exam:   BP (!) 183/77 (BP Location: Left Arm, Patient Position: Sitting, Cuff Size: Normal)   Pulse 61   Ht 5' 1.5" (1.562 m)   Wt 161 lb 3.2 oz (73.1 kg)   LMP 08/14/1991 (Approximate)   BMI 29.97 kg/m   General appearance: alert and no distress Inguinal lymph nodes:  not enlarged  Pelvic: External genitalia:  no lesions              Urethra: normal appearing urethra with no masses, tenderness or lesions              Bartholins and Skenes: Bartholin's, Urethra, Skene's normal                 Vagina: normal appearing vagina with normal color and discharge, no lesions              Cervix: absent   Pap obtained:  no Bimanual Exam:  Uterus:  absent                               Adnexa:    normal adnexa in size, nontender and no masses   Pt had already removed pessary.  Pessary was replaced. Patient tolerated procedure well.    Assessment/Plan: 1. Midline cystocele - doing well.  Will continue to use pessary - Ambulatory referral to Physical Therapy - Recheck 1 year or sooner if needed  2. Stress incontinence - Ambulatory referral to Physical Therapy

## 2022-12-03 ENCOUNTER — Ambulatory Visit (AMBULATORY_SURGERY_CENTER): Payer: Medicare Other

## 2022-12-03 VITALS — Ht 61.5 in | Wt 162.5 lb

## 2022-12-03 DIAGNOSIS — Z1211 Encounter for screening for malignant neoplasm of colon: Secondary | ICD-10-CM

## 2022-12-03 MED ORDER — NA SULFATE-K SULFATE-MG SULF 17.5-3.13-1.6 GM/177ML PO SOLN: 1.0000 | Freq: Once | ORAL | 0 refills | Status: AC

## 2022-12-03 NOTE — Progress Notes (Signed)

## 2022-12-10 DIAGNOSIS — D1801 Hemangioma of skin and subcutaneous tissue: Secondary | ICD-10-CM | POA: Diagnosis not present

## 2022-12-10 DIAGNOSIS — L821 Other seborrheic keratosis: Secondary | ICD-10-CM | POA: Diagnosis not present

## 2022-12-10 DIAGNOSIS — L570 Actinic keratosis: Secondary | ICD-10-CM | POA: Diagnosis not present

## 2022-12-10 DIAGNOSIS — L814 Other melanin hyperpigmentation: Secondary | ICD-10-CM | POA: Diagnosis not present

## 2022-12-10 DIAGNOSIS — Z85828 Personal history of other malignant neoplasm of skin: Secondary | ICD-10-CM | POA: Diagnosis not present

## 2022-12-10 DIAGNOSIS — L723 Sebaceous cyst: Secondary | ICD-10-CM | POA: Diagnosis not present

## 2022-12-10 DIAGNOSIS — D225 Melanocytic nevi of trunk: Secondary | ICD-10-CM | POA: Diagnosis not present

## 2022-12-13 DIAGNOSIS — Z23 Encounter for immunization: Secondary | ICD-10-CM | POA: Diagnosis not present

## 2023-01-05 ENCOUNTER — Encounter: Payer: Self-pay | Admitting: Internal Medicine

## 2023-01-05 ENCOUNTER — Ambulatory Visit: Payer: Medicare Other | Admitting: Internal Medicine

## 2023-01-05 VITALS — BP 155/87 | HR 66 | Temp 97.7°F | Resp 12 | Ht 61.0 in | Wt 162.0 lb

## 2023-01-05 DIAGNOSIS — K5730 Diverticulosis of large intestine without perforation or abscess without bleeding: Secondary | ICD-10-CM | POA: Diagnosis not present

## 2023-01-05 DIAGNOSIS — K648 Other hemorrhoids: Secondary | ICD-10-CM

## 2023-01-05 DIAGNOSIS — D123 Benign neoplasm of transverse colon: Secondary | ICD-10-CM | POA: Diagnosis not present

## 2023-01-05 DIAGNOSIS — F419 Anxiety disorder, unspecified: Secondary | ICD-10-CM | POA: Diagnosis not present

## 2023-01-05 DIAGNOSIS — E039 Hypothyroidism, unspecified: Secondary | ICD-10-CM | POA: Diagnosis not present

## 2023-01-05 DIAGNOSIS — Z1211 Encounter for screening for malignant neoplasm of colon: Secondary | ICD-10-CM

## 2023-01-05 DIAGNOSIS — D128 Benign neoplasm of rectum: Secondary | ICD-10-CM

## 2023-01-05 DIAGNOSIS — I10 Essential (primary) hypertension: Secondary | ICD-10-CM | POA: Diagnosis not present

## 2023-01-05 MED ORDER — SODIUM CHLORIDE 0.9 % IV SOLN: 500.0000 mL | Freq: Once | INTRAVENOUS | Status: DC

## 2023-01-05 NOTE — Progress Notes (Signed)
Vss nad trans to pacu 

## 2023-01-05 NOTE — Patient Instructions (Addendum)
Continue your normal medications Please read over handouts provided Await pathology  YOU HAD AN ENDOSCOPIC PROCEDURE TODAY AT THE [facility]:   Refer to the procedure report that was given to you for any specific questions about what was found during the examination.  If the procedure report does not answer your questions, please call your gastroenterologist to clarify.  If you requested that your care partner not be given the details of your procedure findings, then the procedure report has been included in a sealed envelope for you to review at your convenience later.  YOU SHOULD EXPECT: Some feelings of bloating in the abdomen. Passage of more gas than usual.  Walking can help get rid of the air that was put into your GI tract during the procedure and reduce the bloating. If you had a lower endoscopy (such as a colonoscopy or flexible sigmoidoscopy) you may notice spotting of blood in your stool or on the toilet paper. If you underwent a bowel prep for your procedure, you may not have a normal bowel movement for a few days.  Please Note:  You might notice some irritation and congestion in your nose or some drainage.  This is from the oxygen used during your procedure.  There is no need for concern and it should clear up in a day or so.  SYMPTOMS TO REPORT IMMEDIATELY:  Following lower endoscopy (colonoscopy or flexible sigmoidoscopy):  Excessive amounts of blood in the stool  Significant tenderness or worsening of abdominal pains  Swelling of the abdomen that is new, acute  Fever of 100F or higher For urgent or emergent issues, a gastroenterologist can be reached at any hour by calling [phone]. Do not use MyChart messaging for urgent concerns.    DIET:  We do recommend a small meal at first, but then you may proceed to your regular diet.  Drink plenty of fluids but you should avoid alcoholic beverages for 24 hours.  ACTIVITY:  You should plan to take it easy for the  rest of today and you should NOT DRIVE or use heavy machinery until tomorrow (because of the sedation medicines used during the test).    FOLLOW UP: Our staff will call the number listed on your records the next business day following your procedure.  We will call around 7:15- 8:00 am to check on you and address any questions or concerns that you may have regarding the information given to you following your procedure. If we do not reach you, we will leave a message.     If any biopsies were taken you will be contacted by phone or by letter within the next 1-3 weeks.  Please call us at [phone] if you have not heard about the biopsies in 3 weeks.    SIGNATURES/CONFIDENTIALITY: You and/or your care partner have signed paperwork which will be entered into your electronic medical record.  These signatures attest to the fact that that the information above on your After Visit Summary has been reviewed and is understood.  Full responsibility of the confidentiality of this discharge information lies with you and/or your care-partner.

## 2023-01-05 NOTE — Progress Notes (Signed)
Called to room to assist during endoscopic procedure.  Patient ID and intended procedure confirmed with present staff. Received instructions for my participation in the procedure from the performing physician.  

## 2023-01-05 NOTE — Progress Notes (Signed)
GASTROENTEROLOGY PROCEDURE H&P NOTE   Primary Care Physician: Creola Corn, MD    Reason for Procedure:   Colon cancer screening  Plan:    Colonoscopy  Patient is appropriate for endoscopic procedure(s) in the ambulatory ([facility]) setting.  The nature of the procedure, as well as the risks, benefits, and alternatives were carefully and thoroughly reviewed with the patient. Ample time for discussion and questions allowed. The patient understood, was satisfied, and agreed to proceed.     HPI: Erin Day is a 72 y.o. female who presents for colonoscopy for colon cancer screening. Denies changes in bowel habits or unintentional weight loss. She sometimes has scant rectal bleeding due to hemorrhoids. Denies family history of colon cancer. Last colonoscopy in 2014 showed diverticulosis.  Past Medical History:  Diagnosis Date   Abnormal Pap smear of cervix    Allergy    Anxiety    Arthritis    Diverticulitis    Enterocele    of vaginal vault   Hyperlipidemia 1998   Hypertension    Hypothyroidism 2008    Past Surgical History:  Procedure Laterality Date   ABDOMINAL HYSTERECTOMY  1993   ovaries remain, due to fibroids   COLPOSCOPY     LAPAROSCOPIC CHOLECYSTECTOMY  2004   SKIN CANCER EXCISION Left 06/2012   squamous cell left shoulder   TONSILLECTOMY  1968    Prior to Admission medications   Medication Sig Start Date End Date Taking? Authorizing Provider  Cholecalciferol (D3-1000) 25 MCG (1000 UT) capsule    Yes [provider]  COLLAGEN PO Take by mouth. powder   Yes [provider]  estradiol (ESTRACE) 0.1 MG/GM vaginal cream 1 gram pv twice weekly 11/01/21  Yes Jerene Bears, MD  irbesartan (AVAPRO) 150 MG tablet Take 300 mg by mouth daily.   Yes [provider]  levothyroxine (SYNTHROID, LEVOTHROID) 88 MCG tablet Take 88 mcg by mouth daily before breakfast.   Yes [provider]  Magnesium 250 MG TABS    Yes [provider]  Omega-3 Fatty Acids (FISH OIL PO)    Yes [provider]  Probiotic Product (PROBIOTIC PO)    Yes [provider]  Turmeric Curcumin 500 MG CAPS    Yes [provider]  vitamin C (ASCORBIC ACID) 500 MG tablet Take 1,000 mg by mouth daily.    Yes [provider]    Current Outpatient Medications  Medication Sig Dispense Refill   Cholecalciferol (D3-1000) 25 MCG (1000 UT) capsule      COLLAGEN PO Take by mouth. powder     estradiol (ESTRACE) 0.1 MG/GM vaginal cream 1 gram pv twice weekly 42.5 g 2   irbesartan (AVAPRO) 150 MG tablet Take 300 mg by mouth daily.     levothyroxine (SYNTHROID, LEVOTHROID) 88 MCG tablet Take 88 mcg by mouth daily before breakfast.     Magnesium 250 MG TABS      Omega-3 Fatty Acids (FISH OIL PO)      Probiotic Product (PROBIOTIC PO)      Turmeric Curcumin 500 MG CAPS      vitamin C (ASCORBIC ACID) 500 MG tablet Take 1,000 mg by mouth daily.      Current Facility-Administered Medications  Medication Dose Route Frequency Provider Last Rate Last Admin   0.9 %  sodium chloride infusion  500 mL Intravenous Once Imogene Burn, MD        Allergies as of 01/05/2023 - Review Complete 01/05/2023  Allergen  Reaction Noted   Codeine Nausea And Vomiting 06/04/2012   Morphine and codeine  06/04/2012    Family History  Problem Relation Age of Onset   Heart failure Mother    Hyperlipidemia Mother    Hypertension Father    Dementia Father    Breast cancer Maternal Aunt    Heart failure Paternal Uncle    Breast cancer Maternal Grandmother    Diabetes Maternal Grandmother    Multiple births Paternal Grandmother    Colon cancer Neg Hx    Colon polyps Neg Hx    Esophageal cancer Neg Hx    Rectal cancer Neg Hx    Stomach cancer Neg Hx     Social History   Socioeconomic History   Marital status: Divorced    Spouse name: Not on file   Number of children: Not on file   Years of education: Not on file   Highest  education level: Not on file  Occupational History   Not on file  Tobacco Use   Smoking status: Former    Current packs/day: 0.00    Average packs/day: 0.3 packs/day for 3.0 years (0.8 ttl pk-yrs)    Types: Cigarettes    Start date: 06/04/1969    Quit date: 06/04/1972    Years since quitting: 50.6   Smokeless tobacco: Never   Tobacco comments:    in high school  Vaping Use   Vaping status: Never Used  Substance and Sexual Activity   Alcohol use: Yes    Alcohol/week: 1.0 - 2.0 standard drink of alcohol    Types: 1 - 2 Glasses of wine per week    Comment: ocassional   Drug use: No   Sexual activity: Yes    Birth control/protection: Surgical    Comment: TAH  Other Topics Concern   Not on file  Social History Narrative   Not on file   Social Drivers of Health   Financial Resource Strain: Not on file  Food Insecurity: Not on file  Transportation Needs: Not on file  Physical Activity: Not on file  Stress: Not on file  Social Connections: Not on file  Intimate Partner Violence: Not on file    Physical Exam: Vital signs in last 24 hours: BP (!) 154/91   Pulse 63   Temp 97.7 F (36.5 C)   Ht 5\' 1"  (1.549 m)   Wt 162 lb (73.5 kg)   LMP 08/14/1991 (Approximate)   SpO2 98%   BMI 30.61 kg/m  GEN: NAD EYE: Sclerae anicteric ENT: MMM CV: Non-tachycardic Pulm: No increased work of breathing GI: Soft, NT/ND NEURO:  Alert & Oriented   Eulah Pont, MD [facility]  01/05/2023 9:48 AM

## 2023-01-05 NOTE — Op Note (Signed)
[facility] Patient Name: Erin Day Procedure Date: 01/05/2023 9:48 AM MRN: [id number] Endoscopist: Madelyn Brunner [place] , , [id number] Age: 72 Referring MD:  Date of Birth: 1950/12/16 Gender: Female Account #: [id number] Procedure:                Colonoscopy Indications:              Screening for colorectal malignant neoplasm Medicines:                Monitored Anesthesia Care Procedure:                Pre-Anesthesia Assessment:                           - Prior to the procedure, a History and Physical                            was performed, and patient medications and                            allergies were reviewed. The patient's tolerance of                            previous anesthesia was also reviewed. The risks                            and benefits of the procedure and the sedation                            options and risks were discussed with the patient.                            All questions were answered, and informed consent                            was obtained. Prior Anticoagulants: The patient has                            taken no anticoagulant or antiplatelet agents. ASA                            Grade Assessment: II - A patient with mild systemic                            disease. After reviewing the risks and benefits,                            the patient was deemed in satisfactory condition to                            undergo the procedure.                           After obtaining informed consent, the colonoscope  was passed under direct vision. Throughout the                            procedure, the patient's blood pressure, pulse, and                            oxygen saturations were monitored continuously. The                            Olympus CF-HQ190L ([id number]) Colonoscope was                            introduced through the anus and advanced to the the                            terminal  ileum. The colonoscopy was performed                            without difficulty. The patient tolerated the                            procedure well. The quality of the bowel                            preparation was excellent. The terminal ileum,                            ileocecal valve, appendiceal orifice, and rectum                            were photographed. Scope In: 9:55:07 AM Scope Out: 10:16:54 AM Scope Withdrawal Time: 0 hours 14 minutes 9 seconds  Total Procedure Duration: 0 hours 21 minutes 47 seconds  Findings:                 The terminal ileum appeared normal.                           A 2 mm polyp was found in the transverse colon. The                            polyp was sessile. The polyp was removed with a                            cold biopsy forceps. Resection and retrieval were                            complete.                           A 4 mm polyp was found in the transverse colon. The                            polyp was sessile. The polyp was removed with a  cold snare. Resection and retrieval were complete.                           Multiple diverticula were found in the sigmoid                            colon, descending colon and transverse colon.                           A 15 mm polyp was found in the rectum. The polyp                            was sessile. The polyp was removed with a cold                            snare. Resection and retrieval were complete.                           Non-bleeding internal hemorrhoids were found during                            retroflexion. Complications:            No immediate complications. Estimated Blood Loss:     Estimated blood loss was minimal. Impression:               - The examined portion of the ileum was normal.                           - One 2 mm polyp in the transverse colon, removed                            with a cold biopsy forceps. Resected and retrieved.                            - One 4 mm polyp in the transverse colon, removed                            with a cold snare. Resected and retrieved.                           - Diverticulosis in the sigmoid colon, in the                            descending colon and in the transverse colon.                           - One 15 mm polyp in the rectum, removed with a                            cold snare. Resected and retrieved.                           - Non-bleeding internal hemorrhoids. Recommendation:           -  Discharge patient to home (with escort).                           - Await pathology results.                           - The findings and recommendations were discussed                            with the patient. Dr Particia Lather "Robbins" Dunlo,  01/05/2023 10:25:37 AM

## 2023-01-05 NOTE — Progress Notes (Signed)
Pt's states no medical or surgical changes since previsit or office visit. 

## 2023-01-06 ENCOUNTER — Telehealth: Payer: Self-pay

## 2023-01-06 NOTE — Telephone Encounter (Signed)
  Follow up Call-     01/05/2023    9:33 AM  Call back number  Post procedure Call Back phone  # [phone]  Permission to leave phone message Yes     Patient questions:  Do you have a fever, pain , or abdominal swelling? No. Pain Score  0 *  Have you tolerated food without any problems? Yes.    Have you been able to return to your normal activities? Yes.    Do you have any questions about your discharge instructions: Diet   No. Medications  No. Follow up visit  No.  Do you have questions or concerns about your Care? No.  Actions: * If pain score is 4 or above: No action needed, pain <4.

## 2023-01-09 ENCOUNTER — Other Ambulatory Visit: Payer: Self-pay | Admitting: Internal Medicine

## 2023-01-09 DIAGNOSIS — Z1231 Encounter for screening mammogram for malignant neoplasm of breast: Secondary | ICD-10-CM

## 2023-01-12 ENCOUNTER — Encounter: Payer: Self-pay | Admitting: Internal Medicine

## 2023-01-12 LAB — SURGICAL PATHOLOGY

## 2023-01-26 ENCOUNTER — Ambulatory Visit: Payer: Medicare Other | Admitting: Physical Therapy

## 2023-02-02 ENCOUNTER — Encounter: Payer: Medicare Other | Admitting: Physical Therapy

## 2023-02-09 ENCOUNTER — Encounter: Payer: Medicare Other | Admitting: Physical Therapy

## 2023-02-16 ENCOUNTER — Encounter: Payer: Medicare Other | Admitting: Physical Therapy

## 2023-02-23 ENCOUNTER — Ambulatory Visit
Admission: RE | Admit: 2023-02-23 | Discharge: 2023-02-23 | Disposition: A | Discharge status: Home / Self Care | Payer: Medicare Other | Source: Ambulatory Visit | Attending: Internal Medicine | Admitting: Internal Medicine

## 2023-02-23 DIAGNOSIS — Z1231 Encounter for screening mammogram for malignant neoplasm of breast: Secondary | ICD-10-CM

## 2023-03-23 DIAGNOSIS — E039 Hypothyroidism, unspecified: Secondary | ICD-10-CM | POA: Diagnosis not present

## 2023-03-23 DIAGNOSIS — I7300 Raynaud's syndrome without gangrene: Secondary | ICD-10-CM | POA: Diagnosis not present

## 2023-03-23 DIAGNOSIS — N3941 Urge incontinence: Secondary | ICD-10-CM | POA: Diagnosis not present

## 2023-03-23 DIAGNOSIS — K589 Irritable bowel syndrome without diarrhea: Secondary | ICD-10-CM | POA: Diagnosis not present

## 2023-03-23 DIAGNOSIS — M8580 Other specified disorders of bone density and structure, unspecified site: Secondary | ICD-10-CM | POA: Diagnosis not present

## 2023-03-23 DIAGNOSIS — R809 Proteinuria, unspecified: Secondary | ICD-10-CM | POA: Diagnosis not present

## 2023-03-23 DIAGNOSIS — K635 Polyp of colon: Secondary | ICD-10-CM | POA: Diagnosis not present

## 2023-03-23 DIAGNOSIS — I10 Essential (primary) hypertension: Secondary | ICD-10-CM | POA: Diagnosis not present

## 2023-03-23 DIAGNOSIS — M1990 Unspecified osteoarthritis, unspecified site: Secondary | ICD-10-CM | POA: Diagnosis not present

## 2023-03-23 DIAGNOSIS — E785 Hyperlipidemia, unspecified: Secondary | ICD-10-CM | POA: Diagnosis not present

## 2023-03-23 DIAGNOSIS — F419 Anxiety disorder, unspecified: Secondary | ICD-10-CM | POA: Diagnosis not present

## 2023-03-23 DIAGNOSIS — Z960 Presence of urogenital implants: Secondary | ICD-10-CM | POA: Diagnosis not present

## 2023-04-16 ENCOUNTER — Other Ambulatory Visit (HOSPITAL_BASED_OUTPATIENT_CLINIC_OR_DEPARTMENT_OTHER): Payer: Self-pay | Admitting: Obstetrics & Gynecology

## 2023-04-16 DIAGNOSIS — Z780 Asymptomatic menopausal state: Secondary | ICD-10-CM

## 2023-04-16 DIAGNOSIS — N952 Postmenopausal atrophic vaginitis: Secondary | ICD-10-CM

## 2023-08-15 IMAGING — MG MM DIGITAL SCREENING BILAT W/ TOMO AND CAD
8 series · 9 of 24 positions shown · non-contrast
Comparison: Previous exam(s).

CLINICAL DATA: Screening.

EXAM:
DIGITAL SCREENING BILATERAL MAMMOGRAM WITH TOMOSYNTHESIS AND CAD
TECHNIQUE: Bilateral screening digital craniocaudal and mediolateral oblique
mammograms were obtained. Bilateral screening digital breast
tomosynthesis was performed. The images were evaluated with
computer-aided detection.

[L CC synth-2D]
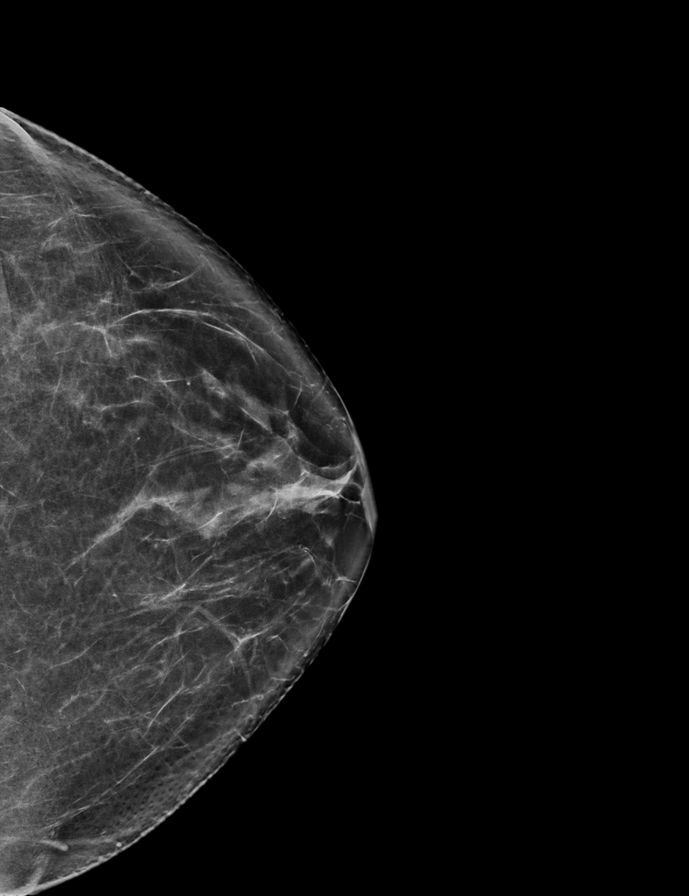

[R CC synth-2D]
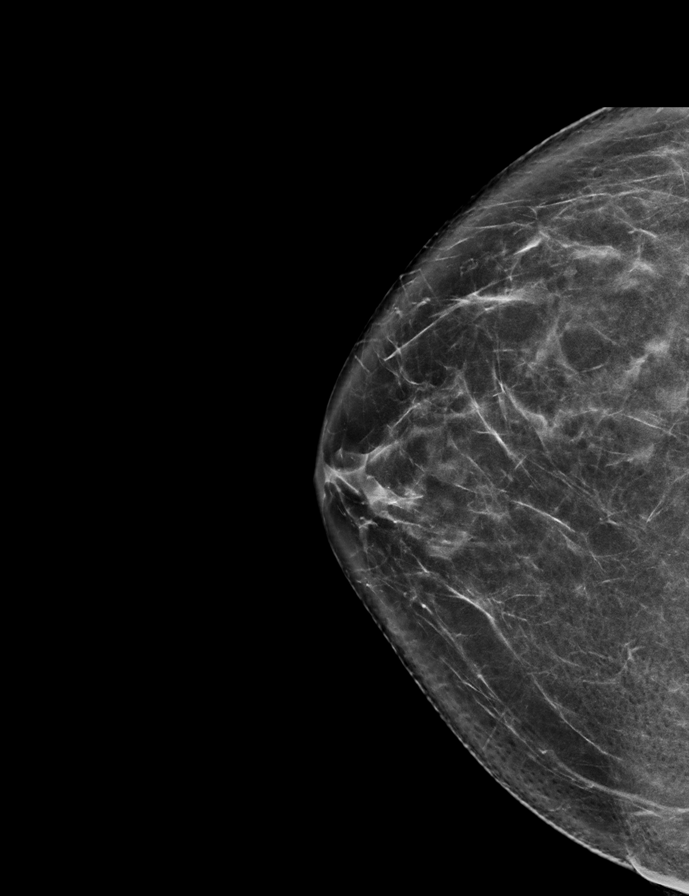

[R MLO synth-2D]
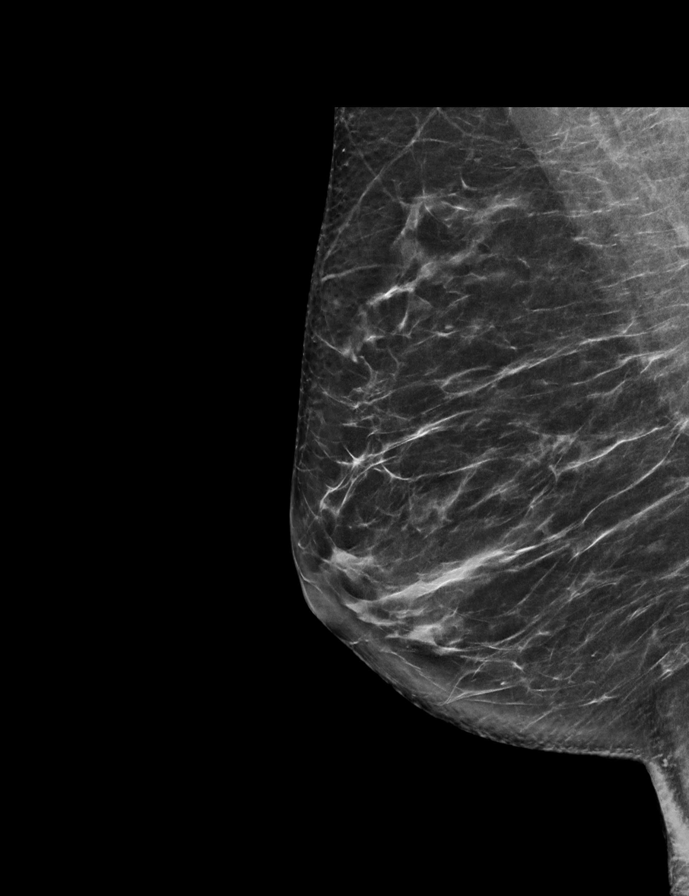

[L MLO synth-2D]
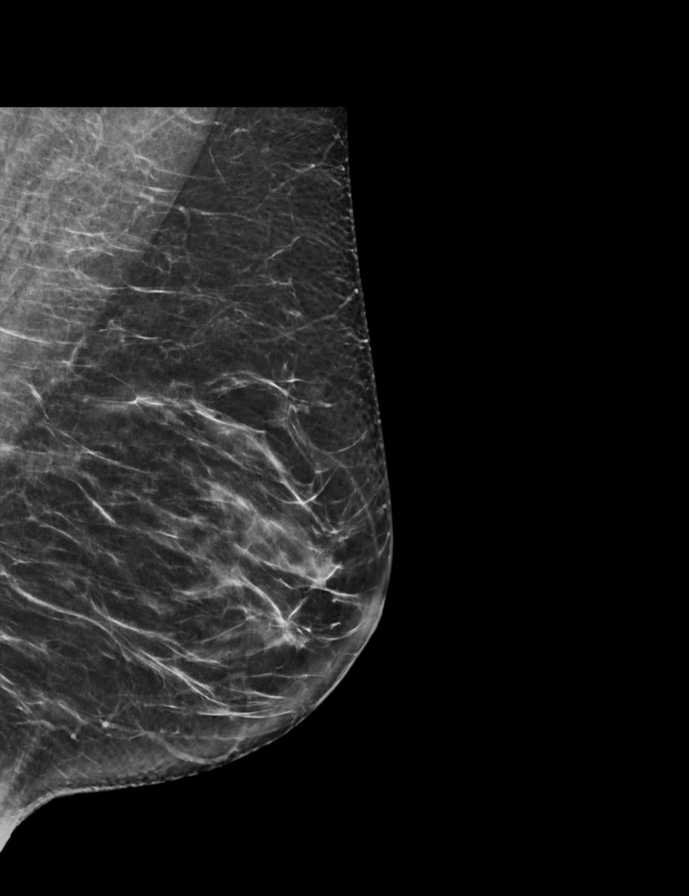

[L CC tomo · 2 of 72 frames shown]
[frame 24/72]
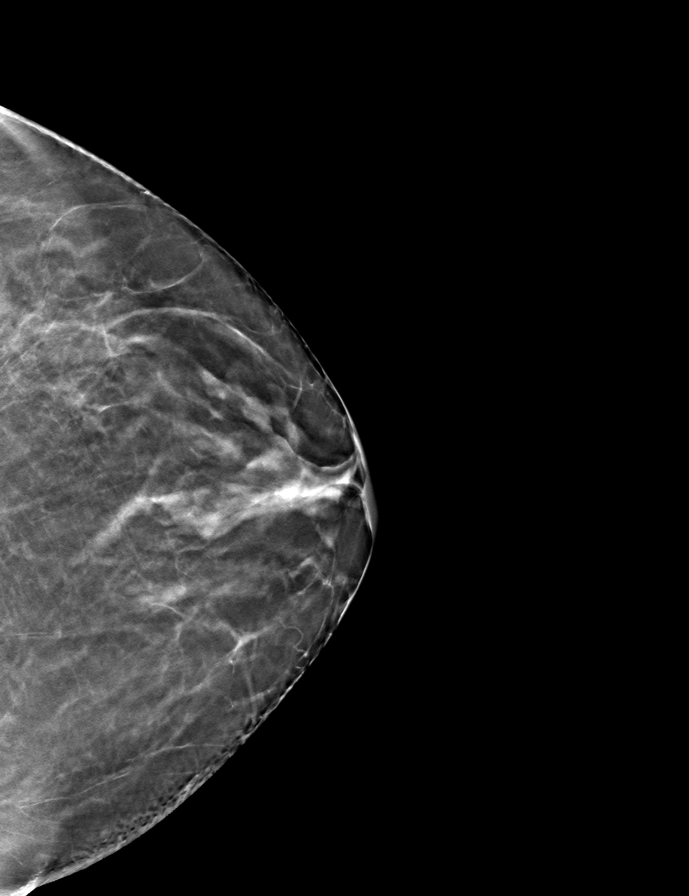
[frame 37/72]
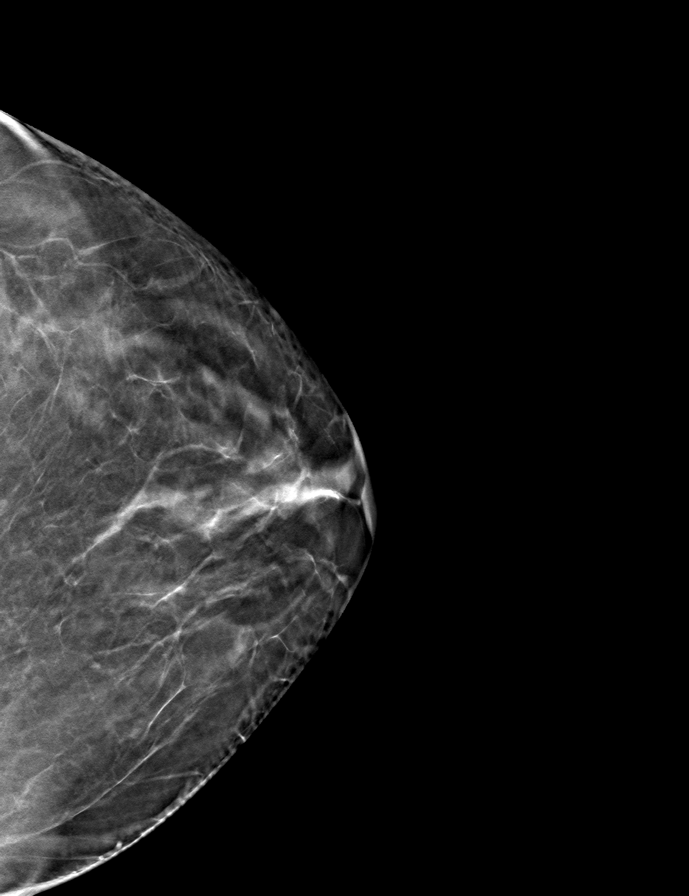

[R MLO tomo · tomo slice 37/73.0]
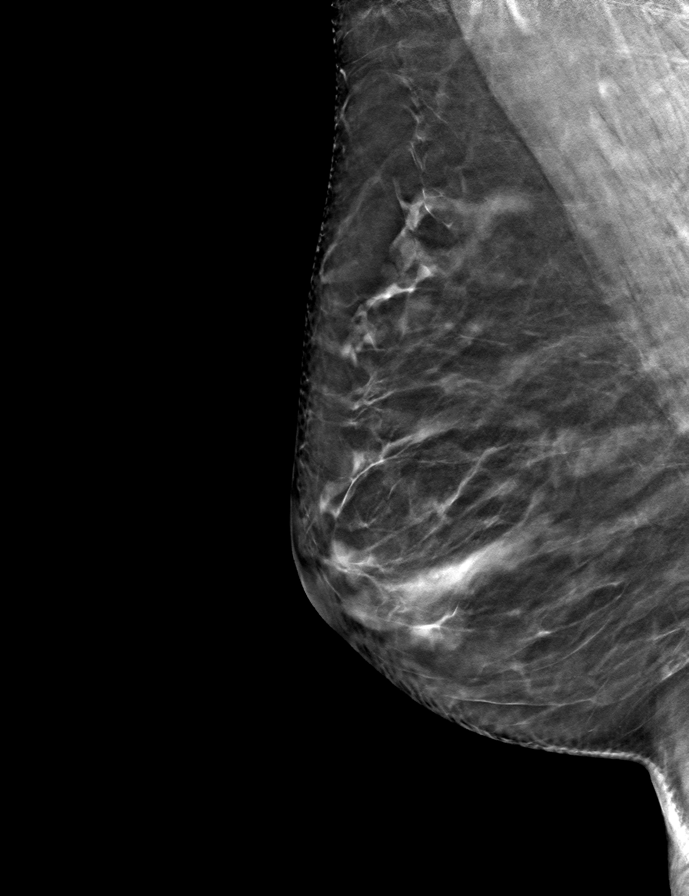

[L MLO tomo · tomo slice 35/70.0]
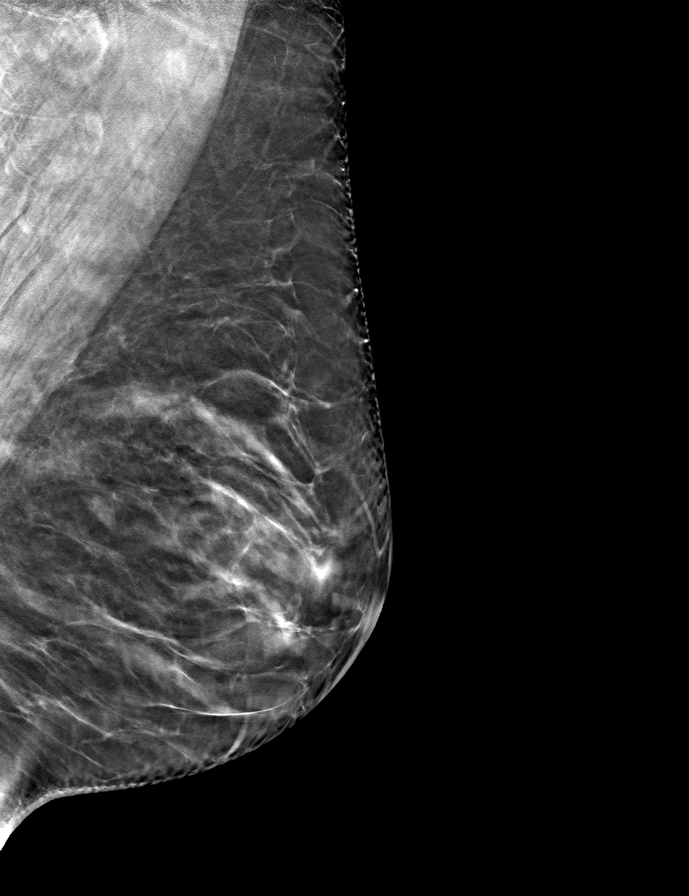

[R CC tomo · tomo slice 37/74.0]
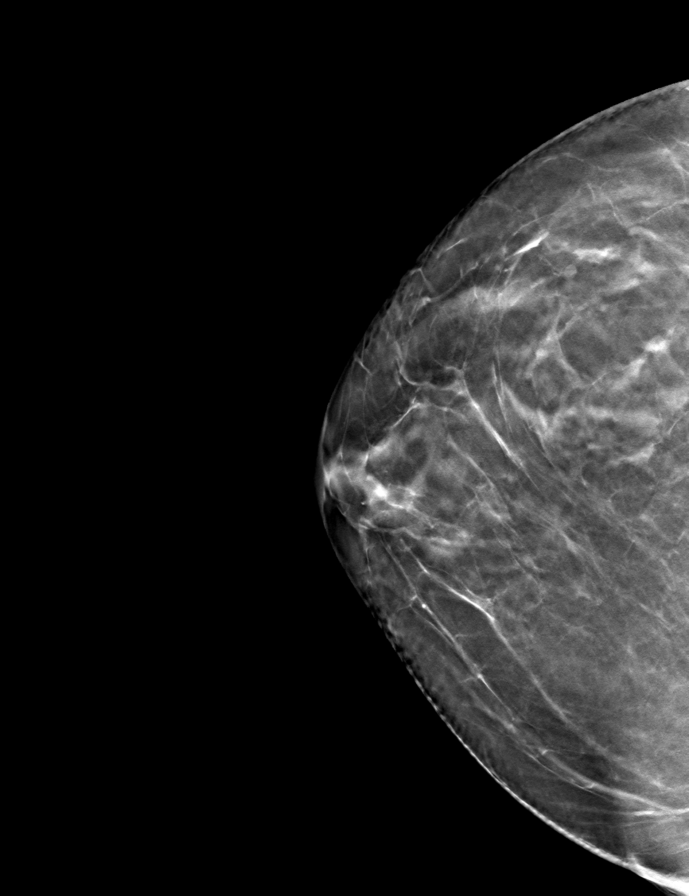

[9 of 24 positions shown; findings below may reference images not displayed]

ACR Breast Density Category b: There are scattered areas of
fibroglandular density.
FINDINGS: There are no findings suspicious for malignancy.
IMPRESSION: No mammographic evidence of malignancy. A result letter of this
screening mammogram will be mailed directly to the patient.

RECOMMENDATION:
Screening mammogram in one year. (Code:51-O-LD2)

BI-RADS CATEGORY  1: Negative.

## 2023-09-21 DIAGNOSIS — E7849 Other hyperlipidemia: Secondary | ICD-10-CM | POA: Diagnosis not present

## 2023-09-21 DIAGNOSIS — E559 Vitamin D deficiency, unspecified: Secondary | ICD-10-CM | POA: Diagnosis not present

## 2023-09-21 DIAGNOSIS — E039 Hypothyroidism, unspecified: Secondary | ICD-10-CM | POA: Diagnosis not present

## 2023-09-21 DIAGNOSIS — Z1212 Encounter for screening for malignant neoplasm of rectum: Secondary | ICD-10-CM | POA: Diagnosis not present

## 2023-09-21 DIAGNOSIS — I10 Essential (primary) hypertension: Secondary | ICD-10-CM | POA: Diagnosis not present

## 2023-09-21 DIAGNOSIS — E785 Hyperlipidemia, unspecified: Secondary | ICD-10-CM | POA: Diagnosis not present

## 2023-09-28 DIAGNOSIS — Z23 Encounter for immunization: Secondary | ICD-10-CM | POA: Diagnosis not present

## 2023-09-28 DIAGNOSIS — M1990 Unspecified osteoarthritis, unspecified site: Secondary | ICD-10-CM | POA: Diagnosis not present

## 2023-09-28 DIAGNOSIS — R82998 Other abnormal findings in urine: Secondary | ICD-10-CM | POA: Diagnosis not present

## 2023-09-28 DIAGNOSIS — Z0000 Encounter for general adult medical examination without abnormal findings: Secondary | ICD-10-CM | POA: Diagnosis not present

## 2023-09-28 DIAGNOSIS — E663 Overweight: Secondary | ICD-10-CM | POA: Diagnosis not present

## 2023-09-28 DIAGNOSIS — Z1331 Encounter for screening for depression: Secondary | ICD-10-CM | POA: Diagnosis not present

## 2023-09-28 DIAGNOSIS — E039 Hypothyroidism, unspecified: Secondary | ICD-10-CM | POA: Diagnosis not present

## 2023-09-28 DIAGNOSIS — M8580 Other specified disorders of bone density and structure, unspecified site: Secondary | ICD-10-CM | POA: Diagnosis not present

## 2023-09-28 DIAGNOSIS — E785 Hyperlipidemia, unspecified: Secondary | ICD-10-CM | POA: Diagnosis not present

## 2023-09-28 DIAGNOSIS — I10 Essential (primary) hypertension: Secondary | ICD-10-CM | POA: Diagnosis not present

## 2023-09-28 DIAGNOSIS — R809 Proteinuria, unspecified: Secondary | ICD-10-CM | POA: Diagnosis not present

## 2023-09-28 DIAGNOSIS — Z1389 Encounter for screening for other disorder: Secondary | ICD-10-CM | POA: Diagnosis not present

## 2023-09-28 DIAGNOSIS — F419 Anxiety disorder, unspecified: Secondary | ICD-10-CM | POA: Diagnosis not present

## 2023-10-09 DIAGNOSIS — Z23 Encounter for immunization: Secondary | ICD-10-CM | POA: Diagnosis not present

## 2024-01-22 ENCOUNTER — Other Ambulatory Visit: Payer: Self-pay | Admitting: Internal Medicine

## 2024-01-22 DIAGNOSIS — Z1231 Encounter for screening mammogram for malignant neoplasm of breast: Secondary | ICD-10-CM

## 2024-02-24 ENCOUNTER — Ambulatory Visit
